# Patient Record
Sex: Female | Born: 2000 | Race: White | Hispanic: No | Marital: Single | Smoking: Never smoker
Health system: Southern US, Community
[De-identification: ages and names within clinical notes are randomized; demographics above are authoritative.]

## PROBLEM LIST (undated history)

## (undated) DIAGNOSIS — Z789 Other specified health status: Secondary | ICD-10-CM

## (undated) HISTORY — PX: NO PAST SURGERIES: SHX2092

## (undated) HISTORY — DX: Other specified health status: Z78.9

---
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
## 2021-01-01 ENCOUNTER — Encounter: Payer: Medicaid Other | Admitting: Obstetrics & Gynecology

## 2021-01-01 ENCOUNTER — Other Ambulatory Visit: Payer: Self-pay

## 2021-01-01 ENCOUNTER — Ambulatory Visit (INDEPENDENT_AMBULATORY_CARE_PROVIDER_SITE_OTHER): Payer: Medicaid Other

## 2021-01-01 VITALS — BP 131/83 | HR 91

## 2021-01-01 DIAGNOSIS — O43199 Other malformation of placenta, unspecified trimester: Secondary | ICD-10-CM

## 2021-01-01 DIAGNOSIS — Z3A36 36 weeks gestation of pregnancy: Secondary | ICD-10-CM | POA: Diagnosis not present

## 2021-01-01 DIAGNOSIS — IMO0002 Reserved for concepts with insufficient information to code with codable children: Secondary | ICD-10-CM

## 2021-01-01 DIAGNOSIS — O0990 Supervision of high risk pregnancy, unspecified, unspecified trimester: Secondary | ICD-10-CM

## 2021-01-01 DIAGNOSIS — O43193 Other malformation of placenta, third trimester: Secondary | ICD-10-CM | POA: Diagnosis not present

## 2021-01-01 NOTE — Progress Notes (Signed)
[place] 36+6 wks,incomplete breech,fhr 133 bpm,posterior placenta gr 2,AFI 10.5 cm,RI .62,.63,.50=44%,BPP 8/8

## 2021-01-03 ENCOUNTER — Ambulatory Visit: Payer: Self-pay | Admitting: *Deleted

## 2021-01-03 NOTE — Telephone Encounter (Signed)
Answer Assessment - Initial Assessment Questions 1. LOCATION: "Where does it hurt?"      Tightness, all over 2. RADIATION: "Does the pain shoot anywhere else?" (e.g., chest, back)     No 3. ONSET: "When did the pain begin?" (Minutes, hours or days ago)      3 days ago 4. ONSET: "Gradual or sudden onset?"     Gradual 5. PATTERN: "Does the pain come and go, or has it been constant since it started?"      Comes and goes, no pattern 6. SEVERITY: "How bad is the pain?" "What does it keep you from doing?"  (e.g., Scale 1-10; mild, moderate, or severe)   - MILD (1-3): doesn't interfere with normal activities, abdomen soft and not tender to touch    - MODERATE (4-7): interferes with normal activities or awakens from sleep, abdomen tender to touch    - SEVERE (8-10): excruciating pain, doubled over, unable to do any normal activities     tightness 7. RECURRENT SYMPTOM: "Have you ever had this type of stomach pain before?" If Yes, ask: "When was the last time?" and "What happened that time?"       8. CAUSE: "What do you think is causing the stomach pain?      9. RELIEVING/AGGRAVATING FACTORS: "What makes it better or worse?" (e.g., antacids, bowel movement, movement)      10. FETAL MOVEMENT: "Has the baby's movement decreased or changed significantly from normal?"       no 11. OTHER SYMPTOMS: "Do you have any other symptoms?" (e.g., back pain, diarrhea, fever, urination pain, vaginal discharge, vomiting)       Vomiting, cannot keep any food down 12. EDD: "What date are you expecting to deliver?"       At 39 weeks, at 37 now  Protocols used: Pregnancy - Abdominal Pain Greater Than [redacted] Weeks EGA-A-AH

## 2021-01-03 NOTE — Telephone Encounter (Signed)
Pt reports [redacted] week pregnant, states is to deliver at 39 weeks. Reports stomach "Tight" x 3 days. Also reports vomiting, states able to drink, stay hydrated, "But can't eat anything." 2 episodes of vomiting this AM. Denies any  pattern to abdominal tightness. "Random" MyChart message sent by RN Crenszo this evening, read to pt, verbalizes understanding. Advised eval at [facility] if worsening. Pt verbalizes understanding.

## 2021-01-04 ENCOUNTER — Other Ambulatory Visit: Payer: Self-pay

## 2021-01-04 ENCOUNTER — Other Ambulatory Visit: Payer: Medicaid Other

## 2021-01-04 ENCOUNTER — Other Ambulatory Visit (HOSPITAL_COMMUNITY)
Admission: RE | Admit: 2021-01-04 | Discharge: 2021-01-04 | Disposition: A | Discharge status: Home / Self Care | Payer: Medicaid Other | Source: Ambulatory Visit | Attending: Medical | Admitting: Medical

## 2021-01-04 ENCOUNTER — Ambulatory Visit (INDEPENDENT_AMBULATORY_CARE_PROVIDER_SITE_OTHER): Payer: Medicaid Other | Admitting: Medical

## 2021-01-04 VITALS — BP 122/83 | HR 84 | Wt 228.0 lb

## 2021-01-04 DIAGNOSIS — Z3A37 37 weeks gestation of pregnancy: Secondary | ICD-10-CM | POA: Diagnosis not present

## 2021-01-04 DIAGNOSIS — O43199 Other malformation of placenta, unspecified trimester: Secondary | ICD-10-CM

## 2021-01-04 DIAGNOSIS — O0990 Supervision of high risk pregnancy, unspecified, unspecified trimester: Secondary | ICD-10-CM

## 2021-01-04 DIAGNOSIS — O365930 Maternal care for other known or suspected poor fetal growth, third trimester, not applicable or unspecified: Secondary | ICD-10-CM

## 2021-01-04 DIAGNOSIS — O321XX0 Maternal care for breech presentation, not applicable or unspecified: Secondary | ICD-10-CM

## 2021-01-04 NOTE — Progress Notes (Signed)
   PRENATAL VISIT NOTE  Subjective:  Christina Shaffer is a 20 y.o. G1P0 at [redacted]w[redacted]d being seen today for ongoing prenatal care.  She is currently monitored for the following issues for this high-risk pregnancy and has Supervision of high risk pregnancy, antepartum; Abnormal chromosomal and genetic finding on antenatal screening mother; Marginal insertion of umbilical cord affecting management of mother; and IUGR (intrauterine growth restriction) affecting care of mother on their problem list.  Patient reports no complaints.  Contractions: Irregular. Vag. Bleeding: None.  Movement: Present. Denies leaking of fluid.   The following portions of the patient's history were reviewed and updated as appropriate: allergies, current medications, past family history, past medical history, past social history, past surgical history and problem list.   Objective:   Vitals:   01/04/21 1059  BP: 122/83  Pulse: 84  Weight: 228 lb (103.4 kg)    Fetal Status: Fetal Heart Rate (bpm): NST   Movement: Present     General:  Alert, oriented and cooperative. Patient is in no acute distress.  Skin: Skin is warm and dry. No rash noted.   Cardiovascular: Normal heart rate noted  Respiratory: Normal respiratory effort, no problems with respiration noted  Abdomen: Soft, gravid, appropriate for gestational age.  Pain/Pressure: Absent     Pelvic: Cervical exam deferred        Extremities: Normal range of motion.  Edema: None  Mental Status: Normal mood and affect. Normal behavior. Normal judgment and thought content.    Fetal Monitoring: Baseline: 130 bpm Variability: moderate Accelerations: 15 x 15, 10 x 10 Decelerations: none Contractions: none  Assessment and Plan:  Pregnancy: G1P0 at [redacted]w[redacted]d 1. Supervision of high risk pregnancy, antepartum - Culture, beta strep (group b only) - Cervicovaginal ancillary only  2. Poor fetal growth affecting management of mother in third trimester, single or unspecified  fetus - Resolved at [place] on 7/19 - EFW 12.5% - Discussed with Dr. Alysia Penna, plan for delivery as indicated at 39 weeks due to breech presentation   3. Marginal insertion of umbilical cord affecting management of mother  4.Breech presentation  - Will schedule for C/S at 39 weeks, declines ECV  5 [redacted] weeks gestation of pregnancy - Culture, beta strep (group b only) - Cervicovaginal ancillary only  Term labor symptoms and general obstetric precautions including but not limited to vaginal bleeding, contractions, leaking of fluid and fetal movement were reviewed in detail with the patient. Please refer to After Visit Summary for other counseling recommendations.   Future Appointments  Date Time Provider Department Center  01/08/2021  1:30 PM [facility] - [facility] [place] [facility] None  01/08/2021  2:30 PM Eure, Amaryllis Dyke, MD [facility]    Vonzella Nipple, PA-C

## 2021-01-07 ENCOUNTER — Other Ambulatory Visit: Payer: Self-pay | Admitting: Obstetrics & Gynecology

## 2021-01-07 DIAGNOSIS — IMO0002 Reserved for concepts with insufficient information to code with codable children: Secondary | ICD-10-CM

## 2021-01-07 DIAGNOSIS — O43199 Other malformation of placenta, unspecified trimester: Secondary | ICD-10-CM

## 2021-01-07 LAB — CERVICOVAGINAL ANCILLARY ONLY
Chlamydia: NEGATIVE
Comment: NEGATIVE
Comment: NORMAL
Neisseria Gonorrhea: NEGATIVE

## 2021-01-08 ENCOUNTER — Other Ambulatory Visit: Payer: Self-pay

## 2021-01-08 ENCOUNTER — Encounter: Payer: Self-pay | Admitting: Obstetrics & Gynecology

## 2021-01-08 ENCOUNTER — Telehealth: Payer: Self-pay | Admitting: *Deleted

## 2021-01-08 ENCOUNTER — Encounter: Payer: Self-pay | Admitting: *Deleted

## 2021-01-08 ENCOUNTER — Ambulatory Visit (INDEPENDENT_AMBULATORY_CARE_PROVIDER_SITE_OTHER): Payer: Medicaid Other | Admitting: Obstetrics & Gynecology

## 2021-01-08 ENCOUNTER — Ambulatory Visit (INDEPENDENT_AMBULATORY_CARE_PROVIDER_SITE_OTHER): Payer: Medicaid Other

## 2021-01-08 VITALS — BP 123/80 | HR 78 | Wt 228.5 lb

## 2021-01-08 DIAGNOSIS — O43199 Other malformation of placenta, unspecified trimester: Secondary | ICD-10-CM | POA: Diagnosis not present

## 2021-01-08 DIAGNOSIS — Z3A37 37 weeks gestation of pregnancy: Secondary | ICD-10-CM

## 2021-01-08 DIAGNOSIS — O0993 Supervision of high risk pregnancy, unspecified, third trimester: Secondary | ICD-10-CM | POA: Diagnosis not present

## 2021-01-08 DIAGNOSIS — O0990 Supervision of high risk pregnancy, unspecified, unspecified trimester: Secondary | ICD-10-CM | POA: Diagnosis not present

## 2021-01-08 DIAGNOSIS — IMO0002 Reserved for concepts with insufficient information to code with codable children: Secondary | ICD-10-CM

## 2021-01-08 DIAGNOSIS — O321XX0 Maternal care for breech presentation, not applicable or unspecified: Secondary | ICD-10-CM

## 2021-01-08 DIAGNOSIS — O365930 Maternal care for other known or suspected poor fetal growth, third trimester, not applicable or unspecified: Secondary | ICD-10-CM

## 2021-01-08 LAB — POCT URINALYSIS DIPSTICK OB
Blood, UA: NEGATIVE
Glucose, UA: NEGATIVE
Ketones, UA: NEGATIVE
Nitrite, UA: NEGATIVE
POC,PROTEIN,UA: NEGATIVE

## 2021-01-08 LAB — CULTURE, BETA STREP (GROUP B ONLY): Strep Gp B Culture: NEGATIVE

## 2021-01-08 NOTE — Progress Notes (Signed)
HIGH-RISK PREGNANCY VISIT Patient name: Christina Shaffer MRN [id number]  Date of birth: Oct 25, 2000 Chief Complaint:   High Risk Gestation ([place] today)  History of Present Illness:   Christina Shaffer is a 20 y.o. G1P0 female at [redacted]w[redacted]d with an Estimated Date of Delivery: 01/23/21 being seen today for ongoing management of a high-risk pregnancy complicated by fetal growth restriction borderline 12.5%.    Today she reports no complaints. Contractions: Not present. Vag. Bleeding: None.  Movement: Present. denies leaking of fluid.   Depression screen [facility] 2/9 10/17/2020 07/18/2020  Decreased Interest 2 3  Down, Depressed, Hopeless 0 0  PHQ - 2 Score 2 3  Altered sleeping 0 0  Tired, decreased energy 0 0  Change in appetite 0 0  Feeling bad or failure about yourself  0 0  Trouble concentrating 0 0  Moving slowly or fidgety/restless 0 0  Suicidal thoughts 0 0  PHQ-9 Score 2 3     GAD 7 : Generalized Anxiety Score 10/17/2020 07/18/2020  Nervous, Anxious, on Edge 0 0  Control/stop worrying 0 0  Worry too much - different things 0 0  Trouble relaxing 0 0  Restless 0 0  Easily annoyed or irritable 0 0  Afraid - awful might happen 0 0  Total GAD 7 Score 0 0     Review of Systems:   Pertinent items are noted in HPI Denies abnormal vaginal discharge w/ itching/odor/irritation, headaches, visual changes, shortness of breath, chest pain, abdominal pain, severe nausea/vomiting, or problems with urination or bowel movements unless otherwise stated above. Pertinent History Reviewed:  Reviewed past medical,surgical, social, obstetrical and family history.  Reviewed problem list, medications and allergies. Physical Assessment:   Vitals:   01/08/21 1401  BP: 123/80  Pulse: 78  Weight: 228 lb 8 oz (103.6 kg)  Body mass index is 38.02 kg/m.           Physical Examination:   General appearance: alert, well appearing, and in no distress  Mental status: alert, oriented to person, place, and  time  Skin: warm & dry   Extremities: Edema: None    Cardiovascular: normal heart rate noted  Respiratory: normal respiratory effort, no distress  Abdomen: gravid, soft, non-tender  Pelvic: Cervical exam deferred         Fetal Status:     Movement: Present    Fetal Surveillance Testing today: BPP 8/8 normal Dopplers   Chaperone: N/A    Results for orders placed or performed in visit on 01/08/21 (from the past 24 hour(s))  POC Urinalysis Dipstick OB   Collection Time: 01/08/21  2:02 PM  Result Value Ref Range   Color, UA     Clarity, UA     Glucose, UA Negative Negative   Bilirubin, UA     Ketones, UA neg    Spec Grav, UA     Blood, UA neg    pH, UA     POC,PROTEIN,UA Negative Negative, Trace, Small (1+), Moderate (2+), Large (3+), 4+   Urobilinogen, UA     Nitrite, UA neg    Leukocytes, UA Moderate (2+) (A) Negative   Appearance     Odor      Assessment & Plan:  High-risk pregnancy: G1P0 at [redacted]w[redacted]d with an Estimated Date of Delivery: 01/23/21   1) FGR, now borderline with MCI fundal anterior, breech, all stable for [facility] 01/16/21    Meds: No orders of the defined types were placed in this encounter.   Labs/procedures today:  [place]  Treatment Plan:  C section 8 days  Reviewed: Term labor symptoms and general obstetric precautions including but not limited to vaginal bleeding, contractions, leaking of fluid and fetal movement were reviewed in detail with the patient.  All questions were answered. Does have home bp cuff. Office bp cuff given: not applicable. Check bp weekly, let us know if consistently >140 and/or >90.  Follow-up: No follow-ups on file.   Future Appointments  Date Time Provider Department Center  01/08/2021  2:30 PM Lazaro Arms, MD [facility]    Orders Placed This Encounter  Procedures   POC Urinalysis Dipstick OB   Lazaro Arms 01/08/2021 2:27 PM

## 2021-01-08 NOTE — Telephone Encounter (Signed)
Call to patient- no answer and no voice mail on cell number. Call to home- advised patient is not available- left message to call back to [place] at [phone]. Calling to advise cesarean section is scheduled on 01-16-21 at 0930.

## 2021-01-08 NOTE — Progress Notes (Signed)
[place] 37+6 wks,frank breech,posterior placenta gr 3,afi 11 cm,RI .50,.57,.61,.55=60%,FHR 132 BPM,BPP 8/8

## 2021-01-08 NOTE — Telephone Encounter (Signed)
Call from patient. Advised of scheduled c-section on 01-16-21 at 0930 and arrive 0730.  Advised letter will be mailed and she will receive pre-op call with instructions.   Encounter closed.

## 2021-01-09 ENCOUNTER — Encounter (HOSPITAL_COMMUNITY): Payer: Self-pay

## 2021-01-09 NOTE — Patient Instructions (Signed)
Christina Shaffer  01/09/2021   Your procedure is scheduled on:  01/16/2021  Arrive at 0745 at Entrance C on [address] at [facility]  and [employer]. You are invited to use the FREE valet parking or use the Visitor's parking deck.  Pick up the phone at the desk and dial [phone].  Call this number if you have problems the morning of surgery: [phone]  Remember:   Do not eat food:(After Midnight) Desps de medianoche.  Do not drink clear liquids: (After Midnight) Desps de medianoche.  Take these medicines the morning of surgery with A SIP OF WATER:  none   Do not wear jewelry, make-up or nail polish.  Do not wear lotions, powders, or perfumes. Do not wear deodorant.  Do not shave 48 hours prior to surgery.  Do not bring valuables to the hospital.  [facility] is not   responsible for any belongings or valuables brought to the hospital.  Contacts, dentures or bridgework may not be worn into surgery.  Leave suitcase in the car. After surgery it may be brought to your room.  For patients admitted to the hospital, checkout time is 11:00 AM the day of              discharge.      Please read over the following fact sheets that you were given:     Preparing for Surgery

## 2021-01-11 DIAGNOSIS — H66002 Acute suppurative otitis media without spontaneous rupture of ear drum, left ear: Secondary | ICD-10-CM | POA: Diagnosis not present

## 2021-01-12 ENCOUNTER — Other Ambulatory Visit: Payer: Self-pay | Admitting: Obstetrics and Gynecology

## 2021-01-14 ENCOUNTER — Other Ambulatory Visit: Payer: Self-pay

## 2021-01-14 ENCOUNTER — Encounter (HOSPITAL_COMMUNITY)
Admission: RE | Admit: 2021-01-14 | Discharge: 2021-01-14 | Disposition: A | Discharge status: Home / Self Care | Payer: Medicaid Other | Source: Ambulatory Visit | Attending: Obstetrics and Gynecology | Admitting: Obstetrics and Gynecology

## 2021-01-14 ENCOUNTER — Other Ambulatory Visit: Payer: Self-pay | Admitting: Obstetrics and Gynecology

## 2021-01-14 DIAGNOSIS — Z01812 Encounter for preprocedural laboratory examination: Secondary | ICD-10-CM | POA: Insufficient documentation

## 2021-01-14 LAB — CBC
HCT: 39.4 % (ref 36.0–46.0)
Hemoglobin: 12.9 g/dL (ref 12.0–15.0)
MCH: 27.9 pg (ref 26.0–34.0)
MCHC: 32.7 g/dL (ref 30.0–36.0)
MCV: 85.3 fL (ref 80.0–100.0)
Platelets: 228 10*3/uL (ref 150–400)
RBC: 4.62 MIL/uL (ref 3.87–5.11)
RDW: 14 % (ref 11.5–15.5)
WBC: 6.7 10*3/uL (ref 4.0–10.5)
nRBC: 0 % (ref 0.0–0.2)

## 2021-01-14 LAB — TYPE AND SCREEN
ABO/RH(D): O POS
Antibody Screen: NEGATIVE

## 2021-01-15 LAB — SARS CORONAVIRUS 2 (TAT 6-24 HRS): SARS Coronavirus 2: NEGATIVE

## 2021-01-15 LAB — RPR: RPR Ser Ql: NONREACTIVE

## 2021-01-15 NOTE — H&P (Addendum)
OBSTETRIC ADMISSION HISTORY AND PHYSICAL  MATHA MASSE is a 20 y.o. female G1P0 with IUP at [redacted]w[redacted]d by LMP consistent with 8 week [place] presenting for scheduled primary CS for breech positioning. She reports +FMs, no LOF, no VB, no blurry vision, headaches, peripheral edema, or RUQ pain.  She plans on bottle feeding but is interested in learning more about breast feeding as well. She is planning to use condoms for birth control.  She received her prenatal care at [facility]   Dating: By LMP --->  Estimated Date of Delivery: 01/23/21  Sono:    @[redacted]w[redacted]d , CWD, normal anatomy, frank breech presentation, placenta posterior  @ [redacted]w[redacted]d, CWD, normal anatomy, frank breech, placenta posterior, 2358 g, 12.5 % EFW  Prenatal History/Complications:  IUGR Abnormal genetic screening Marginal cord insertion  Past Medical History: Past Medical History:  Diagnosis Date   Medical history non-contributory     Past Surgical History: Past Surgical History:  Procedure Laterality Date   NO PAST SURGERIES      Obstetrical History: OB History     Gravida  1   Para      Term      Preterm      AB      Living         SAB      IAB      Ectopic      Multiple      Live Births              Social History Social History   Socioeconomic History   Marital status: Single    Spouse name: Not on file   Number of children: Not on file   Years of education: Not on file   Highest education level: Not on file  Occupational History   Not on file  Tobacco Use   Smoking status: Never   Smokeless tobacco: Never  Vaping Use   Vaping Use: Never used  Substance and Sexual Activity   Alcohol use: Not Currently   Drug use: No   Sexual activity: Yes    Birth control/protection: None  Other Topics Concern   Not on file  Social History Narrative   Not on file   Social Determinants of Health   Financial Resource Strain: Low Risk    Difficulty of Paying Living Expenses: Not hard at all   Food Insecurity: No Food Insecurity   Worried About [redacted]w[redacted]d in the Last Year: Never true   Ran Out of Food in the Last Year: Never true  Transportation Needs: No Transportation Needs   Lack of Transportation (Medical): No   Lack of Transportation (Non-Medical): No  Physical Activity: Sufficiently Active   Days of Exercise per Week: 4 days   Minutes of Exercise per Session: 60 min  Stress: No Stress Concern Present   Feeling of Stress : Not at all  Social Connections: Moderately Isolated   Frequency of Communication with Friends and Family: More than three times a week   Frequency of Social Gatherings with Friends and Family: More than three times a week   Attends Religious Services: 1 to 4 times per year   Active Member of Programme researcher, broadcasting/film/video or Organizations: No   Attends [employer]: Never   Marital Status: Never married    Family History: Family History  Problem Relation Age of Onset   Heart disease Maternal Grandmother     Allergies: No Known Allergies  Medications Prior to Admission  Medication Sig Dispense Refill Last Dose   Prenatal Vit-Fe Fumarate-FA (PRENATAL VITAMINS) 28-0.8 MG TABS Take 1 tablet by mouth daily. 90 tablet 3 01/15/2021   Blood Pressure Monitor MISC For regular home bp monitoring during pregnancy (Patient not taking: No sig reported) 1 each 0      Review of Systems   All systems reviewed and negative except as stated in HPI  Blood pressure (!) 131/91, pulse 91, temperature 98.7 F (37.1 C), temperature source Oral, height 5\' 5"  (1.651 m), weight 108.9 kg, last menstrual period 04/18/2020, SpO2 99 %.  General appearance: alert, cooperative, and no distress Lungs: clear to auscultation bilaterally Heart: regular rate and rhythm Abdomen: soft, non-tender; bowel sounds normal Extremities: no LE edema Presentation: breech confirmed by bedside ultrasound  Prenatal labs: ABO, Rh: --/--/O POS (08/08 1032) Antibody: NEG (08/08  1032) Rubella: 4.34 (02/08 1155) RPR: NON REACTIVE (08/08 1024)  HBsAg: Negative (02/08 1155)  HIV: Non Reactive (05/11 0826)  GBS: Negative/-- (07/29 1534)  1 hr Glucola 134 Genetic screening - Abnormal Panorama with atypical sex chromosome finding, normal anatomy on 09[birth year]25-1989 Anatomy US normal  Prenatal Transfer Tool  Maternal Diabetes: No Genetic Screening: Abnormal:  Results: Other: Panorama with atypical sex chromosome finding Maternal Ultrasounds/Referrals: Normal Fetal Ultrasounds or other Referrals:  Referred to Materal Fetal Medicine  Maternal Substance Abuse:  No Significant Maternal Medications:  None Significant Maternal Lab Results: Group B Strep negative  No results found for this or any previous visit (from the past 24 hour(s)).  Patient Active Problem List   Diagnosis Date Noted   Breech presentation 01/16/2021   IUGR (intrauterine growth restriction) affecting care of mother 11/28/2020   Marginal insertion of umbilical cord affecting management of mother 08/23/2020   Abnormal chromosomal and genetic finding on antenatal screening mother 08/07/2020   Supervision of high risk pregnancy, antepartum 07/13/2020    Assessment/Plan:  GENESI STEFANKO is a 20 y.o. G1P0 at [redacted]w[redacted]d here for scheduled primary Cesarean section due to breech positioning.  Counseled regarding VBAC; patient declines VBAC.  The risks of surgery were discussed with the patient including but were not limited to: bleeding which may require transfusion or reoperation; infection which may require antibiotics; injury to bowel, bladder, ureters or other surrounding organs; injury to the fetus; need for additional procedures including hysterectomy in the event of a life-threatening hemorrhage; formation of adhesions; placental abnormalities with subsequent pregnancies; incisional problems; thromboembolic phenomenon and other postoperative/anesthesia complications.  The patient concurred with the proposed plan,  giving informed written consent for the procedure.   Patient has been NPO since 6 PM last evening and she will remain NPO for procedure. Anesthesia and OR aware. Preoperative prophylactic antibiotics and SCDs ordered on call to the OR.  To OR when ready.  # Elevated BP: initial BPs elevated. Asymptomatic. Will check preE labs, monitor #IUGR: Normal dopplers on 8/2 with normal BPP; EFW 12.5% at [redacted]w[redacted]d #Abnormal genetic screening: Noted on Panorama with atypical sex chromosome finding; [redacted]w[redacted]d with normal anatomy #Pain: Spinal #ID:  GBS negative; will receive antibiotics pre-operatively  #MOF: Bottle #MOC: Condoms  [place], MD  Obstetrics Fellow 01/16/2021, 8:58 AM

## 2021-01-16 ENCOUNTER — Inpatient Hospital Stay (HOSPITAL_COMMUNITY)
Admission: RE | Admit: 2021-01-16 | Discharge: 2021-01-18 | DRG: 788 | Disposition: A | Discharge status: Home / Self Care | Payer: Medicaid Other | Attending: Obstetrics and Gynecology | Admitting: Obstetrics and Gynecology

## 2021-01-16 ENCOUNTER — Other Ambulatory Visit: Payer: Self-pay

## 2021-01-16 ENCOUNTER — Encounter (HOSPITAL_COMMUNITY): Payer: Self-pay | Admitting: Obstetrics and Gynecology

## 2021-01-16 ENCOUNTER — Inpatient Hospital Stay (HOSPITAL_COMMUNITY): Payer: Medicaid Other | Admitting: Anesthesiology

## 2021-01-16 ENCOUNTER — Encounter (HOSPITAL_COMMUNITY)
Admission: RE | Disposition: A | Discharge status: Home / Self Care | Payer: Self-pay | Source: Home / Self Care | Attending: Obstetrics and Gynecology

## 2021-01-16 DIAGNOSIS — O321XX0 Maternal care for breech presentation, not applicable or unspecified: Principal | ICD-10-CM | POA: Diagnosis present

## 2021-01-16 DIAGNOSIS — O285 Abnormal chromosomal and genetic finding on antenatal screening of mother: Secondary | ICD-10-CM | POA: Diagnosis present

## 2021-01-16 DIAGNOSIS — O43199 Other malformation of placenta, unspecified trimester: Secondary | ICD-10-CM | POA: Diagnosis present

## 2021-01-16 DIAGNOSIS — O365930 Maternal care for other known or suspected poor fetal growth, third trimester, not applicable or unspecified: Secondary | ICD-10-CM | POA: Diagnosis present

## 2021-01-16 DIAGNOSIS — Z3A38 38 weeks gestation of pregnancy: Secondary | ICD-10-CM

## 2021-01-16 DIAGNOSIS — O43123 Velamentous insertion of umbilical cord, third trimester: Secondary | ICD-10-CM | POA: Diagnosis present

## 2021-01-16 DIAGNOSIS — O365990 Maternal care for other known or suspected poor fetal growth, unspecified trimester, not applicable or unspecified: Secondary | ICD-10-CM | POA: Diagnosis present

## 2021-01-16 DIAGNOSIS — Z3A39 39 weeks gestation of pregnancy: Secondary | ICD-10-CM | POA: Diagnosis not present

## 2021-01-16 DIAGNOSIS — O99214 Obesity complicating childbirth: Secondary | ICD-10-CM | POA: Diagnosis present

## 2021-01-16 LAB — COMPREHENSIVE METABOLIC PANEL
ALT: 15 U/L (ref 0–44)
AST: 19 U/L (ref 15–41)
Albumin: 2.5 g/dL — ABNORMAL LOW (ref 3.5–5.0)
Alkaline Phosphatase: 144 U/L — ABNORMAL HIGH (ref 38–126)
Anion gap: 7 (ref 5–15)
BUN: <5 mg/dL — ABNORMAL LOW (ref 6–20)
CO2: 22 mmol/L (ref 22–32)
Calcium: 8.8 mg/dL — ABNORMAL LOW (ref 8.9–10.3)
Chloride: 108 mmol/L (ref 98–111)
Creatinine, Ser: 0.79 mg/dL (ref 0.44–1.00)
GFR, Estimated: >60 mL/min (ref >60)
Glucose, Bld: 86 mg/dL (ref 70–99)
Potassium: 3.9 mmol/L (ref 3.5–5.1)
Sodium: 137 mmol/L (ref 135–145)
Total Bilirubin: 0.3 mg/dL (ref 0.3–1.2)
Total Protein: 5.4 g/dL — ABNORMAL LOW (ref 6.5–8.1)

## 2021-01-16 LAB — CBC
HCT: 36.1 % (ref 36.0–46.0)
Hemoglobin: 11.9 g/dL — ABNORMAL LOW (ref 12.0–15.0)
MCH: 27.8 pg (ref 26.0–34.0)
MCHC: 33 g/dL (ref 30.0–36.0)
MCV: 84.3 fL (ref 80.0–100.0)
Platelets: 206 10*3/uL (ref 150–400)
RBC: 4.28 MIL/uL (ref 3.87–5.11)
RDW: 14.1 % (ref 11.5–15.5)
WBC: 6.8 10*3/uL (ref 4.0–10.5)
nRBC: 0 % (ref 0.0–0.2)

## 2021-01-16 LAB — PROTEIN / CREATININE RATIO, URINE
Creatinine, Urine: 169.08 mg/dL
Protein Creatinine Ratio: 0.18 mg/mg{Cre} — ABNORMAL HIGH (ref 0.00–0.15)
Total Protein, Urine: 31 mg/dL

## 2021-01-16 SURGERY — Surgical Case
Anesthesia: Spinal | Wound class: Clean Contaminated

## 2021-01-16 MED ORDER — KETOROLAC TROMETHAMINE 30 MG/ML IJ SOLN
INTRAMUSCULAR | Status: AC
  Filled 2021-01-16: qty 1

## 2021-01-16 MED ORDER — CEFAZOLIN SODIUM-DEXTROSE 2-4 GM/100ML-% IV SOLN
2.0000 g | INTRAVENOUS | Status: AC
  Administered 2021-01-16: 2 g via INTRAVENOUS

## 2021-01-16 MED ORDER — SODIUM CHLORIDE 0.9 % IR SOLN
Status: DC | PRN
  Administered 2021-01-16: 1000 mL

## 2021-01-16 MED ORDER — DIPHENHYDRAMINE HCL 25 MG PO CAPS: 25.0000 mg | ORAL_CAPSULE | ORAL | Status: DC | PRN

## 2021-01-16 MED ORDER — SCOPOLAMINE 1 MG/3DAYS TD PT72
MEDICATED_PATCH | TRANSDERMAL | Status: AC
  Filled 2021-01-16: qty 1

## 2021-01-16 MED ORDER — SOD CITRATE-CITRIC ACID 500-334 MG/5ML PO SOLN
ORAL | Status: AC
  Filled 2021-01-16: qty 30

## 2021-01-16 MED ORDER — CEFAZOLIN SODIUM-DEXTROSE 2-4 GM/100ML-% IV SOLN
INTRAVENOUS | Status: AC
  Filled 2021-01-16: qty 100

## 2021-01-16 MED ORDER — SENNOSIDES-DOCUSATE SODIUM 8.6-50 MG PO TABS
2.0000 | ORAL_TABLET | ORAL | Status: DC
  Administered 2021-01-16 – 2021-01-17 (×2): 2 via ORAL
  Filled 2021-01-16 (×2): qty 2

## 2021-01-16 MED ORDER — LACTATED RINGERS IV BOLUS
500.0000 mL | Freq: Once | INTRAVENOUS | Status: AC
  Administered 2021-01-16: 500 mL via INTRAVENOUS

## 2021-01-16 MED ORDER — IBUPROFEN 600 MG PO TABS
600.0000 mg | ORAL_TABLET | Freq: Four times a day (QID) | ORAL | Status: DC
  Administered 2021-01-16 – 2021-01-18 (×8): 600 mg via ORAL
  Filled 2021-01-16 (×8): qty 1

## 2021-01-16 MED ORDER — MORPHINE SULFATE (PF) 0.5 MG/ML IJ SOLN
INTRAMUSCULAR | Status: AC
  Filled 2021-01-16: qty 10

## 2021-01-16 MED ORDER — KETOROLAC TROMETHAMINE 30 MG/ML IJ SOLN
30.0000 mg | Freq: Four times a day (QID) | INTRAMUSCULAR | Status: AC | PRN
  Administered 2021-01-16: 30 mg via INTRAVENOUS

## 2021-01-16 MED ORDER — BUPIVACAINE IN DEXTROSE 0.75-8.25 % IT SOLN
INTRATHECAL | Status: DC | PRN
  Administered 2021-01-16: 1.6 mL via INTRATHECAL

## 2021-01-16 MED ORDER — ACETAMINOPHEN 500 MG PO TABS
1000.0000 mg | ORAL_TABLET | Freq: Four times a day (QID) | ORAL | Status: DC
  Administered 2021-01-16 – 2021-01-18 (×8): 1000 mg via ORAL
  Filled 2021-01-16 (×8): qty 2

## 2021-01-16 MED ORDER — DIPHENHYDRAMINE HCL 50 MG/ML IJ SOLN: 12.5000 mg | INTRAMUSCULAR | Status: DC | PRN

## 2021-01-16 MED ORDER — LACTATED RINGERS IV SOLN: INTRAVENOUS | Status: DC

## 2021-01-16 MED ORDER — MEPERIDINE HCL 25 MG/ML IJ SOLN: 6.2500 mg | INTRAMUSCULAR | Status: DC | PRN

## 2021-01-16 MED ORDER — BUPIVACAINE IN DEXTROSE 0.75-8.25 % IT SOLN
INTRATHECAL | Status: AC
  Filled 2021-01-16: qty 2

## 2021-01-16 MED ORDER — MENTHOL 3 MG MT LOZG: 1.0000 | LOZENGE | OROMUCOSAL | Status: DC | PRN

## 2021-01-16 MED ORDER — COCONUT OIL OIL: 1.0000 "application " | TOPICAL_OIL | Status: DC | PRN

## 2021-01-16 MED ORDER — SODIUM CHLORIDE 0.9% FLUSH: 3.0000 mL | INTRAVENOUS | Status: DC | PRN

## 2021-01-16 MED ORDER — FENTANYL CITRATE (PF) 100 MCG/2ML IJ SOLN
INTRAMUSCULAR | Status: AC
  Filled 2021-01-16: qty 2

## 2021-01-16 MED ORDER — DIBUCAINE (PERIANAL) 1 % EX OINT: 1.0000 "application " | TOPICAL_OINTMENT | CUTANEOUS | Status: DC | PRN

## 2021-01-16 MED ORDER — ONDANSETRON HCL 4 MG/2ML IJ SOLN: 4.0000 mg | Freq: Three times a day (TID) | INTRAMUSCULAR | Status: DC | PRN

## 2021-01-16 MED ORDER — PRENATAL MULTIVITAMIN CH
1.0000 | ORAL_TABLET | Freq: Every day | ORAL | Status: DC
  Administered 2021-01-18: 1 via ORAL
  Filled 2021-01-16 (×2): qty 1

## 2021-01-16 MED ORDER — MEASLES, MUMPS & RUBELLA VAC IJ SOLR: 0.5000 mL | Freq: Once | INTRAMUSCULAR | Status: DC

## 2021-01-16 MED ORDER — NALBUPHINE HCL 10 MG/ML IJ SOLN: 5.0000 mg | INTRAMUSCULAR | Status: DC | PRN

## 2021-01-16 MED ORDER — ONDANSETRON HCL 4 MG/2ML IJ SOLN
INTRAMUSCULAR | Status: AC
  Filled 2021-01-16: qty 2

## 2021-01-16 MED ORDER — POVIDONE-IODINE 10 % EX SWAB: 2.0000 "application " | Freq: Once | CUTANEOUS | Status: DC

## 2021-01-16 MED ORDER — KETOROLAC TROMETHAMINE 30 MG/ML IJ SOLN: 30.0000 mg | Freq: Four times a day (QID) | INTRAMUSCULAR | Status: AC | PRN

## 2021-01-16 MED ORDER — STERILE WATER FOR IRRIGATION IR SOLN
Status: DC | PRN
  Administered 2021-01-16: 1000 mL

## 2021-01-16 MED ORDER — LACTATED RINGERS IV SOLN: INTRAVENOUS | Status: DC | PRN

## 2021-01-16 MED ORDER — ONDANSETRON HCL 4 MG/2ML IJ SOLN
INTRAMUSCULAR | Status: DC | PRN
  Administered 2021-01-16: 4 mg via INTRAVENOUS

## 2021-01-16 MED ORDER — FENTANYL CITRATE (PF) 100 MCG/2ML IJ SOLN: 25.0000 ug | INTRAMUSCULAR | Status: DC | PRN

## 2021-01-16 MED ORDER — OXYTOCIN-SODIUM CHLORIDE 30-0.9 UT/500ML-% IV SOLN
2.5000 [IU]/h | INTRAVENOUS | Status: AC
  Administered 2021-01-16: 2.5 [IU]/h via INTRAVENOUS
  Filled 2021-01-16: qty 500

## 2021-01-16 MED ORDER — SIMETHICONE 80 MG PO CHEW: 80.0000 mg | CHEWABLE_TABLET | ORAL | Status: DC | PRN

## 2021-01-16 MED ORDER — SIMETHICONE 80 MG PO CHEW
80.0000 mg | CHEWABLE_TABLET | Freq: Three times a day (TID) | ORAL | Status: DC
  Administered 2021-01-16 – 2021-01-18 (×5): 80 mg via ORAL
  Filled 2021-01-16 (×5): qty 1

## 2021-01-16 MED ORDER — NALOXONE HCL 0.4 MG/ML IJ SOLN: 0.4000 mg | INTRAMUSCULAR | Status: DC | PRN

## 2021-01-16 MED ORDER — SCOPOLAMINE 1 MG/3DAYS TD PT72
1.0000 | MEDICATED_PATCH | TRANSDERMAL | Status: DC
  Administered 2021-01-16: 1.5 mg via TRANSDERMAL
  Filled 2021-01-16: qty 1

## 2021-01-16 MED ORDER — OXYTOCIN-SODIUM CHLORIDE 30-0.9 UT/500ML-% IV SOLN
INTRAVENOUS | Status: DC | PRN
  Administered 2021-01-16: 30 [IU] via INTRAVENOUS

## 2021-01-16 MED ORDER — NALOXONE HCL 4 MG/10ML IJ SOLN
1.0000 ug/kg/h | INTRAVENOUS | Status: DC | PRN
  Filled 2021-01-16: qty 5

## 2021-01-16 MED ORDER — NALBUPHINE HCL 10 MG/ML IJ SOLN: 5.0000 mg | Freq: Once | INTRAMUSCULAR | Status: DC | PRN

## 2021-01-16 MED ORDER — OXYCODONE HCL 5 MG PO TABS
5.0000 mg | ORAL_TABLET | ORAL | Status: DC | PRN
  Administered 2021-01-17: 5 mg via ORAL
  Filled 2021-01-16: qty 1

## 2021-01-16 MED ORDER — DIPHENHYDRAMINE HCL 25 MG PO CAPS: 25.0000 mg | ORAL_CAPSULE | Freq: Four times a day (QID) | ORAL | Status: DC | PRN

## 2021-01-16 MED ORDER — SOD CITRATE-CITRIC ACID 500-334 MG/5ML PO SOLN
30.0000 mL | Freq: Once | ORAL | Status: AC
  Administered 2021-01-16: 30 mL via ORAL

## 2021-01-16 MED ORDER — WITCH HAZEL-GLYCERIN EX PADS: 1.0000 "application " | MEDICATED_PAD | CUTANEOUS | Status: DC | PRN

## 2021-01-16 MED ORDER — ENOXAPARIN SODIUM 60 MG/0.6ML IJ SOSY
0.5000 mg/kg | PREFILLED_SYRINGE | INTRAMUSCULAR | Status: DC
  Administered 2021-01-17 – 2021-01-18 (×2): 55 mg via SUBCUTANEOUS
  Filled 2021-01-16 (×2): qty 0.6

## 2021-01-16 MED ORDER — MORPHINE SULFATE (PF) 0.5 MG/ML IJ SOLN
INTRAMUSCULAR | Status: DC | PRN
  Administered 2021-01-16: 150 ug via INTRATHECAL

## 2021-01-16 MED ORDER — FENTANYL CITRATE (PF) 100 MCG/2ML IJ SOLN
INTRAMUSCULAR | Status: DC | PRN
  Administered 2021-01-16: 15 ug via INTRATHECAL

## 2021-01-16 MED ORDER — PHENYLEPHRINE HCL-NACL 20-0.9 MG/250ML-% IV SOLN
INTRAVENOUS | Status: DC | PRN
  Administered 2021-01-16: 60 ug/min via INTRAVENOUS

## 2021-01-16 MED ORDER — ZOLPIDEM TARTRATE 5 MG PO TABS: 5.0000 mg | ORAL_TABLET | Freq: Every evening | ORAL | Status: DC | PRN

## 2021-01-16 SURGICAL SUPPLY — 38 items
APL SKNCLS STERI-STRIP NONHPOA (GAUZE/BANDAGES/DRESSINGS) ×1
BENZOIN TINCTURE PRP APPL 2/3 (GAUZE/BANDAGES/DRESSINGS) ×2 IMPLANT
CHLORAPREP W/TINT 26ML (MISCELLANEOUS) ×2 IMPLANT
CLAMP CORD UMBIL (MISCELLANEOUS) IMPLANT
CLOTH BEACON ORANGE TIMEOUT ST (SAFETY) ×2 IMPLANT
DRAPE C SECTION CLR SCREEN (DRAPES) ×1 IMPLANT
DRSG OPSITE POSTOP 4X10 (GAUZE/BANDAGES/DRESSINGS) ×2 IMPLANT
ELECT REM PT RETURN 9FT ADLT (ELECTROSURGICAL) ×2
ELECTRODE REM PT RTRN 9FT ADLT (ELECTROSURGICAL) ×1 IMPLANT
EXTRACTOR VACUUM M CUP 4 TUBE (SUCTIONS) IMPLANT
GLOVE BIOGEL PI IND STRL 7.0 (GLOVE) ×2 IMPLANT
GLOVE BIOGEL PI IND STRL 7.5 (GLOVE) ×2 IMPLANT
GLOVE BIOGEL PI INDICATOR 7.0 (GLOVE) ×2
GLOVE BIOGEL PI INDICATOR 7.5 (GLOVE) ×2
GLOVE ECLIPSE 7.5 STRL STRAW (GLOVE) ×2 IMPLANT
GOWN STRL REUS W/TWL LRG LVL3 (GOWN DISPOSABLE) ×6 IMPLANT
KIT ABG SYR 3ML LUER SLIP (SYRINGE) IMPLANT
NDL HYPO 25X5/8 SAFETYGLIDE (NEEDLE) IMPLANT
NEEDLE HYPO 25X5/8 SAFETYGLIDE (NEEDLE) IMPLANT
NS IRRIG 1000ML POUR BTL (IV SOLUTION) ×2 IMPLANT
PACK C SECTION WH (CUSTOM PROCEDURE TRAY) ×2 IMPLANT
PAD OB MATERNITY 4.3X12.25 (PERSONAL CARE ITEMS) ×2 IMPLANT
PENCIL SMOKE EVAC W/HOLSTER (ELECTROSURGICAL) ×2 IMPLANT
RETRACTOR TRAXI PANNICULUS (MISCELLANEOUS) IMPLANT
RTRCTR C-SECT PINK 25CM LRG (MISCELLANEOUS) ×2 IMPLANT
STRIP CLOSURE SKIN 1/2X4 (GAUZE/BANDAGES/DRESSINGS) ×2 IMPLANT
SUT PLAIN 2 0 (SUTURE) ×2
SUT PLAIN ABS 2-0 CT1 27XMFL (SUTURE) IMPLANT
SUT VIC AB 0 CT1 36 (SUTURE) ×2 IMPLANT
SUT VIC AB 0 CTX 36 (SUTURE) ×4
SUT VIC AB 0 CTX36XBRD ANBCTRL (SUTURE) ×2 IMPLANT
SUT VIC AB 2-0 CT1 27 (SUTURE) ×2
SUT VIC AB 2-0 CT1 TAPERPNT 27 (SUTURE) ×1 IMPLANT
SUT VIC AB 4-0 KS 27 (SUTURE) ×2 IMPLANT
TOWEL OR 17X24 6PK STRL BLUE (TOWEL DISPOSABLE) ×2 IMPLANT
TRAXI PANNICULUS RETRACTOR (MISCELLANEOUS) ×1
TRAY FOLEY W/BAG SLVR 14FR LF (SET/KITS/TRAYS/PACK) ×2 IMPLANT
WATER STERILE IRR 1000ML POUR (IV SOLUTION) ×2 IMPLANT

## 2021-01-16 NOTE — Anesthesia Preprocedure Evaluation (Addendum)
Anesthesia Evaluation  Patient identified by MRN, date of birth, ID band Patient awake    Reviewed: Allergy & Precautions, NPO status , Patient's Chart, lab work & pertinent test results  Airway Mallampati: II  TM Distance: >3 FB Neck ROM: Full    Dental no notable dental hx. (+) Teeth Intact, Dental Advisory Given   Pulmonary neg pulmonary ROS,    Pulmonary exam normal breath sounds clear to auscultation       Cardiovascular negative cardio ROS Normal cardiovascular exam Rhythm:Regular Rate:Normal     Neuro/Psych negative neurological ROS  negative psych ROS   GI/Hepatic Neg liver ROS, GERD  ,  Endo/Other  Obesity  Renal/GU negative Renal ROS  negative genitourinary   Musculoskeletal negative musculoskeletal ROS (+)   Abdominal (+) + obese,   Peds  Hematology negative hematology ROS (+)   Anesthesia Other Findings   Reproductive/Obstetrics (+) Pregnancy Breech presentation 39 weeks                            Anesthesia Physical Anesthesia Plan  ASA: 3  Anesthesia Plan: Spinal   Post-op Pain Management:    Induction:   PONV Risk Score and Plan: 4 or greater and Treatment may vary due to age or medical condition, Ondansetron and Scopolamine patch - Pre-op  Airway Management Planned: Natural Airway  Additional Equipment:   Intra-op Plan:   Post-operative Plan:   Informed Consent: I have reviewed the patients History and Physical, chart, labs and discussed the procedure including the risks, benefits and alternatives for the proposed anesthesia with the patient or authorized representative who has indicated his/her understanding and acceptance.     Dental advisory given  Plan Discussed with: CRNA and Anesthesiologist  Anesthesia Plan Comments:        Anesthesia Quick Evaluation

## 2021-01-16 NOTE — Discharge Summary (Addendum)
Postpartum Discharge Summary  Date of Service updated: 01/18/21     Patient Name: Christina Shaffer DOB: 2001-02-19 MRN: [id number]  Date of admission: 01/16/2021 Delivery date:01/16/2021  Delivering provider: Laurey Arrow BEDFORD  Date of discharge: 01/18/2021  Admitting diagnosis: Breech presentation [O32.1XX0] Intrauterine pregnancy: [redacted]w[redacted]d    Secondary diagnosis:  Principal Problem:   Cesarean delivery delivered Active Problems:   Abnormal chromosomal and genetic finding on antenatal screening mother   Marginal insertion of umbilical cord affecting management of mother   IUGR (intrauterine growth restriction) affecting care of mother   Breech presentation  Additional problems: Elevated BP x1; Pre-E labs obtained on admission wnl    Discharge diagnosis: Term Pregnancy Delivered                                              Post partum procedures: N/A Augmentation: N/A Complications: None  Hospital course: Sceduled C/S   20y.o. yo G1P1001 at 355w0das admitted to the hospital 01/16/2021 for scheduled cesarean section with the following indication:Malpresentation.Delivery details are as follows:  Membrane Rupture Time/Date: 10:06 AM ,01/16/2021   Delivery Method:C-Section, Low Transverse  Details of operation can be found in separate operative note.  Patient had an uncomplicated postpartum course.  She is ambulating, tolerating a regular diet, passing flatus, and urinating well. Patient is discharged home in stable condition on  01/18/21  - pad: I counseled the patient on continuing home Bps intermittently for one week given initial Bps on admissions. Pt states she has cuff at home and will check them and can submit them through babscripts or will call the office if elevated        Newborn Data: Birth date:01/16/2021  Birth time:10:07 AM  Gender:Female  Living status:Living  Apgars:8 ,9  Weight:2815 g     Magnesium Sulfate received: No BMZ received:  No Rhophylac:N/A MMR:N/A - Immune T-DaP: Needs postpartum Flu: No Transfusion:No  Physical exam  Vitals:   01/16/21 2340 01/17/21 0600 01/17/21 2136 01/18/21 0458  BP: 121/82 123/82 125/80 124/69  Pulse: 75 65 (!) 53 (!) 47  Resp: 17 18 18    Temp: 98.2 F (36.8 C) 98 F (36.7 C) 97.6 F (36.4 C) 98.4 F (36.9 C)  TempSrc: Oral Oral Oral Oral  SpO2: 98% 100%    Weight:      Height:       General: alert, cooperative, and no distress Lochia: appropriate Uterine Fundus: firm Incision: Dressing is clean, dry, and intact DVT Evaluation: No evidence of DVT seen on physical exam. Labs: Lab Results  Component Value Date   WBC 6.8 01/16/2021   HGB 10.7 (L) 01/17/2021   HCT 36.1 01/16/2021   MCV 84.3 01/16/2021   PLT 206 01/16/2021   CMP Latest Ref Rng & Units 01/16/2021  Glucose 70 - 99 mg/dL 86  BUN 6 - 20 mg/dL <5(L)  Creatinine 0.44 - 1.00 mg/dL 0.79  Sodium 135 - 145 mmol/L 137  Potassium 3.5 - 5.1 mmol/L 3.9  Chloride 98 - 111 mmol/L 108  CO2 22 - 32 mmol/L 22  Calcium 8.9 - 10.3 mg/dL 8.8(L)  Total Protein 6.5 - 8.1 g/dL 5.4(L)  Total Bilirubin 0.3 - 1.2 mg/dL 0.3  Alkaline Phos 38 - 126 U/L 144(H)  AST 15 - 41 U/L 19  ALT 0 - 44 U/L 15   [place]  Score: Edinburgh Postnatal Depression Scale Screening Tool 01/17/2021  I have been able to laugh and see the funny side of things. 0  I have looked forward with enjoyment to things. 0  I have blamed myself unnecessarily when things went wrong. 0  I have been anxious or worried for no good reason. 0  I have felt scared or panicky for no good reason. 0  Things have been getting on top of me. 0  I have been so unhappy that I have had difficulty sleeping. 0  I have felt sad or miserable. 0  I have been so unhappy that I have been crying. 0  The thought of harming myself has occurred to me. 0  Edinburgh Postnatal Depression Scale Total 0     After visit meds:  Allergies as of 01/18/2021   No Known Allergies       Medication List     STOP taking these medications    Blood Pressure Monitor Misc   Prenatal Vitamins 28-0.8 MG Tabs       TAKE these medications    acetaminophen 325 MG tablet Commonly known as: Tylenol Take 2 tablets (650 mg total) by mouth every 6 (six) hours as needed.   ibuprofen 600 MG tablet Commonly known as: ADVIL Take 1 tablet (600 mg total) by mouth every 6 (six) hours.   oxyCODONE 5 MG immediate release tablet Commonly known as: Oxy IR/ROXICODONE Take 1 tablet (5 mg total) by mouth every 4 (four) hours as needed for moderate pain.         Discharge home in stable condition Infant Feeding: Bottle Infant Disposition:home with mother Discharge instruction: per After Visit Summary and Postpartum booklet. Activity: Advance as tolerated. Pelvic rest for 6 weeks.  Diet: routine diet Future Appointments: Future Appointments  Date Time Provider [place]  01/25/2021 11:10 AM Roma Schanz, CNM [facility]  02/21/2021 10:50 AM Cresenzo-Dishmon, Joaquim Lai, CNM [facility]   Follow up Visit: Message sent on 8/10.  Please schedule this patient for a In person postpartum visit in 6 weeks with the following provider: Any provider. Additional Postpartum F/U:Incision check 1 week  Low risk pregnancy complicated by:  breech presentation, IUGR Delivery mode:  C-Section, Low Transverse  Anticipated Birth Control:  Condoms   01/18/2021 Erskine Emery, MD

## 2021-01-16 NOTE — Transfer of Care (Signed)
Immediate Anesthesia Transfer of Care Note  Patient: Christina Shaffer  Procedure(s) Performed: CESAREAN SECTION  Patient Location: PACU  Anesthesia Type:Spinal  Level of Consciousness: awake  Airway & Oxygen Therapy: Patient Spontanous Breathing  Post-op Assessment: Report given to RN and Post -op Vital signs reviewed and stable  Post vital signs: Reviewed and stable  Last Vitals:  Vitals Value Taken Time  BP    Temp    Pulse    Resp    SpO2      Last Pain:  Vitals:   01/16/21 0802  TempSrc: Oral  PainSc: 0-No pain         Complications: No notable events documented.

## 2021-01-16 NOTE — Anesthesia Postprocedure Evaluation (Signed)
Anesthesia Post Note  Patient: [name]  Procedure(s) Performed: CESAREAN SECTION     Patient location during evaluation: PACU Anesthesia Type: Spinal Level of consciousness: awake and alert and oriented Pain management: pain level controlled Vital Signs Assessment: post-procedure vital signs reviewed and stable Respiratory status: spontaneous breathing, nonlabored ventilation and respiratory function stable Cardiovascular status: blood pressure returned to baseline and stable Postop Assessment: no headache, no backache, spinal receding, patient able to bend at knees and no apparent nausea or vomiting Anesthetic complications: no   No notable events documented.  Last Vitals:  Vitals:   01/16/21 1135 01/16/21 1148  BP:  117/75  Pulse: 84 65  Resp: 17 17  Temp:    SpO2: 99% 99%    Last Pain:  Vitals:   01/16/21 1148  TempSrc:   PainSc: 0-No pain   Pain Goal:    LLE Motor Response: Purposeful movement (01/16/21 1148)   RLE Motor Response: Purposeful movement (01/16/21 1148)       Epidural/Spinal Function Cutaneous sensation: Tingles (01/16/21 1148), Patient able to flex knees: No (01/16/21 1148), Patient able to lift hips off bed: No (01/16/21 1148), Back pain beyond tenderness at insertion site: No (01/16/21 1148), Progressively worsening motor and/or sensory loss: No (01/16/21 1148), Bowel and/or bladder incontinence post epidural: No (01/16/21 1148)  Thersa Mohiuddin A.

## 2021-01-16 NOTE — Op Note (Signed)
Cesarean Section Operative Report  Christina Shaffer  01/16/2021  Indications: Breech Presentation   Pre-operative Diagnosis: Primary Cesarean Section- Breech.   Post-operative Diagnosis: Same   Surgeon: Surgeon(s) and Role:    * Wouk, Wilfred Curtis, MD - Primary    * Worthy Rancher, MD - Assisting   Attending Attestation: I was present and scrubbed for the entire procedure.   Anesthesia: Spinal    Estimated Blood Loss: 363 ml  Total IV Fluids: 1000 ml LR  Urine Output:: 100 ml   Specimens: None  Findings: Viable female infant in frank breech presentation; Apgars 8 and 9; weight pending; clear amniotic fluid; intact placenta with three vessel cord; normal uterus, fallopian tubes and ovaries bilaterally.  Baby condition / location:  Couplet care / Skin to Skin   Complications: No complications  Indications: Christina Shaffer is a 20 y.o. G1P1001 with an IUP [redacted]w[redacted]d presenting for scheduled primary Cesarean section for breech presentation.  The risks, benefits, complications, treatment options, and exected outcomes were discussed with the patient . The patient dwith the proposed plan, giving informed consent, identified as [name] and the procedure verified as C-Section Delivery.  Procedure Details:  The patient was taken back to the operative suite where spinal anesthesia was placed.  A time out was held and the above information confirmed.   After induction of anesthesia, the patient was draped and prepped in the usual sterile manner and placed in a dorsal supine position with a leftward tilt. A Pfannenstiel incision was made and carried down through the subcutaneous tissue to the fascia. Fascial incision was made and bluntly extended transversely. The fascia was separated from the underlying rectus tissue superiorly and inferiorly. The peritoneum was identified and bluntly entered and extended longitudinally. Alexis retractor was placed. A low transverse  uterine incision was made and extended bluntly. Delivered from breech presentation was a viable infant with Apgars and weight as above.  After waiting 25 seconds for delayed cord cutting, the umbilical cord was clamped and cut cord blood was obtained for evaluation. Cord ph was not sent. The placenta was removed Intact and appeared normal. The uterine outline, tubes and ovaries appeared normal. The uterine incision was closed with running locked sutures of 0-Vicryl with an imbricating layer of the same. Irrigation was performed. Hemostasis was observed. The peritoneum was closed with 2-0 Vicryl. The rectus muscles were examined and hemostasis observed. The fascia was then reapproximated with running sutures of 0-Vicryl. The subcuticular closure was performed using 2-0 plain gut. The skin was closed with 4-0 Vicryl.  Instrument, sponge, and needle counts were correct prior the abdominal closure and were correct at the conclusion of the case.   Disposition: PACU - hemodynamically stable.   Maternal Condition: Stable   Signed: Valentina Shaggy AlbertMD 01/16/2021 10:57 AM Obstetrics Fellow

## 2021-01-16 NOTE — Progress Notes (Addendum)
RN noticed pt out was <38mL per hour and dark amber. RN encouraged pt to continue drinking PO fluid frequently. RN notified Dr. Dorma Russell of pt low output and new orders were placed for LR bolus. RN will updated Dr. Dorma Russell of pt output within 4 hours of bolus. RN will continue to monitor.  Herbert Moors, RN

## 2021-01-17 LAB — HEMOGLOBIN: Hemoglobin: 10.7 g/dL — ABNORMAL LOW (ref 12.0–15.0)

## 2021-01-17 NOTE — Progress Notes (Addendum)
Post Operative Day 1  Subjective: No complaints, up ad lib, voiding, and tolerating PO. Lochia is normal. Patient says she is hydrating well today and says yesterday's fluid bolus made her feel much better. She has passed flatus.  Objective: Blood pressure 123/82, pulse 65, temperature 98 F (36.7 C), temperature source Oral, resp. rate 18, height 5\' 5"  (1.651 m), weight 108.9 kg, last menstrual period 04/18/2020, SpO2 100 %, unknown if currently breastfeeding.  Physical Exam:  General: alert and no distress Lochia: appropriate Uterine Fundus: firm Incision: healing well, mild strike through bleeding on incision site DVT Evaluation: no LE edema  Recent Labs    01/16/21 0934 01/17/21 0550  HGB 11.9* 10.7*  HCT 36.1  --     Assessment/Plan: -Patient is meeting postpartum milestones  -VSS -Hgb 10.7 post-operatively; down from 11.9 -Voiding well -BP elevated on admission; continues to be in normal range postpartum -Feeding: Bottle -Contraception: Declined; will use condoms -Plan for discharge tomorrow   LOS: 1 day   03/19/21 01/17/2021, 2:24 PM   GME ATTESTATION:  I saw and evaluated the patient. I agree with the findings and the plan of care as documented in the student's note.  Progressing well postpartum. No concerns today. Incision is healing well. Plan for discharge tomorrow.  03/19/2021, MD OB Fellow, Faculty [facility] 01/17/2021 2:24 PM

## 2021-01-18 LAB — BIRTH TISSUE RECOVERY COLLECTION (PLACENTA DONATION)

## 2021-01-18 MED ORDER — OXYCODONE HCL 5 MG PO TABS: 5.0000 mg | ORAL_TABLET | ORAL | 0 refills | Status: DC | PRN

## 2021-01-18 MED ORDER — ACETAMINOPHEN 325 MG PO TABS: 650.0000 mg | ORAL_TABLET | Freq: Four times a day (QID) | ORAL | Status: DC | PRN

## 2021-01-18 MED ORDER — IBUPROFEN 600 MG PO TABS: 600.0000 mg | ORAL_TABLET | Freq: Four times a day (QID) | ORAL | 0 refills | Status: DC

## 2021-01-21 ENCOUNTER — Telehealth: Payer: Self-pay

## 2021-01-21 DIAGNOSIS — H66002 Acute suppurative otitis media without spontaneous rupture of ear drum, left ear: Secondary | ICD-10-CM | POA: Diagnosis not present

## 2021-01-21 NOTE — Telephone Encounter (Signed)
Transition Care Management Unsuccessful Follow-up Telephone Call  Date of discharge and from where:  01/18/2021-[facility]   Attempts:  1st Attempt  Reason for unsuccessful TCM follow-up call:  Voice mail full

## 2021-01-22 NOTE — Telephone Encounter (Signed)
Transition Care Management Unsuccessful Follow-up Telephone Call  Date of discharge and from where:  01/18/2021 from [facility]  Attempts:  2nd Attempt  Reason for unsuccessful TCM follow-up call:  Voice mail full

## 2021-01-23 NOTE — Telephone Encounter (Signed)
Transition Care Management Unsuccessful Follow-up Telephone Call  Date of discharge and from where:  01/18/2021-[facility]   Attempts:  3rd Attempt  Reason for unsuccessful TCM follow-up call:  Voice mail full

## 2021-01-25 ENCOUNTER — Encounter: Payer: Self-pay | Admitting: Women's Health

## 2021-01-25 ENCOUNTER — Other Ambulatory Visit: Payer: Self-pay

## 2021-01-25 ENCOUNTER — Ambulatory Visit (INDEPENDENT_AMBULATORY_CARE_PROVIDER_SITE_OTHER): Payer: Medicaid Other | Admitting: Women's Health

## 2021-01-25 VITALS — BP 114/77 | HR 72 | Ht 65.0 in | Wt 208.0 lb

## 2021-01-25 DIAGNOSIS — Z4889 Encounter for other specified surgical aftercare: Secondary | ICD-10-CM

## 2021-01-25 NOTE — Progress Notes (Signed)
   GYN VISIT Patient name: Christina Shaffer MRN [id number]  Date of birth: 2001/02/07 Chief Complaint:   Post-op Follow-up (Incision check)  History of Present Illness:   Christina Shaffer is a 20 y.o. G26P1001 Caucasian female 9d s/p PCS for breech, being seen today for incision check. Bottlefeeding, no PPD/anxiety. Plans condoms.    No LMP recorded. The current method of family planning is abstinence.  Last pap <21yo. Results were: N/A  Depression screen [facility] 2/9 10/17/2020 07/18/2020  Decreased Interest 2 3  Down, Depressed, Hopeless 0 0  PHQ - 2 Score 2 3  Altered sleeping 0 0  Tired, decreased energy 0 0  Change in appetite 0 0  Feeling bad or failure about yourself  0 0  Trouble concentrating 0 0  Moving slowly or fidgety/restless 0 0  Suicidal thoughts 0 0  PHQ-9 Score 2 3     GAD 7 : Generalized Anxiety Score 10/17/2020 07/18/2020  Nervous, Anxious, on Edge 0 0  Control/stop worrying 0 0  Worry too much - different things 0 0  Trouble relaxing 0 0  Restless 0 0  Easily annoyed or irritable 0 0  Afraid - awful might happen 0 0  Total GAD 7 Score 0 0     Review of Systems:   Pertinent items are noted in HPI Denies fever/chills, dizziness, headaches, visual disturbances, fatigue, shortness of breath, chest pain, abdominal pain, vomiting, abnormal vaginal discharge/itching/odor/irritation, problems with periods, bowel movements, urination, or intercourse unless otherwise stated above.  Pertinent History Reviewed:  Reviewed past medical,surgical, social, obstetrical and family history.  Reviewed problem list, medications and allergies. Physical Assessment:   Vitals:   01/25/21 1119  BP: 114/77  Pulse: 72  Weight: 208 lb (94.3 kg)  Height: 5\' 5"  (1.651 m)  Body mass index is 34.61 kg/m.       Physical Examination:   General appearance: alert, well appearing, and in no distress  Mental status: alert, oriented to person, place, and time  Skin: warm & dry    Cardiovascular: normal heart rate noted  Respiratory: normal respiratory effort, no distress  Abdomen: soft, non-tender, c/s incision healing well, no s/s of infection   Pelvic: examination not indicated  Extremities: no edema   Chaperone: N/A    No results found for this or any previous visit (from the past 24 hour(s)).  Assessment & Plan:  1) 9d s/p PCS for breech> bottlefeeding  2) Incision check> healing well  Meds: No orders of the defined types were placed in this encounter.   No orders of the defined types were placed in this encounter.   Return for As scheduled for pp visit.  CNM, [facility] 01/25/2021 11:45 AM

## 2021-01-29 ENCOUNTER — Telehealth (HOSPITAL_COMMUNITY): Payer: Self-pay

## 2021-01-29 NOTE — Telephone Encounter (Signed)
No answer. The mailbox is full and cannot except any messages at this time.   Christina Shaffer [facility] 01/29/2021,1149

## 2021-02-21 ENCOUNTER — Other Ambulatory Visit: Payer: Self-pay

## 2021-02-21 ENCOUNTER — Encounter: Payer: Self-pay | Admitting: Advanced Practice Midwife

## 2021-02-21 ENCOUNTER — Ambulatory Visit (INDEPENDENT_AMBULATORY_CARE_PROVIDER_SITE_OTHER): Payer: Medicaid Other | Admitting: Advanced Practice Midwife

## 2021-02-21 DIAGNOSIS — H9202 Otalgia, left ear: Secondary | ICD-10-CM | POA: Diagnosis not present

## 2021-02-21 NOTE — Progress Notes (Signed)
Post Partum Visit Note   Chief Complaint:   Postpartum Care  History of Present Illness:   Christina Shaffer is a 20 y.o. G62P1001 Caucasian female being seen today for a postpartum visit. She is 6 weeks postpartum following a primary cesarean section, low transverse incision at 39 gestational weeks.for breech. Anesthesia: spinal.  Laceration: n/a.  Complications: none. Inpatient contraception: no.   Pregnancy uncomplicated. Tobacco use: no. Substance use disorder: no No LMP recorded (lmp unknown).  Postpartum course has been uncomplicated. Bleeding no bleeding. Bowel function is normal. Bladder function is normal. Urinary incontinence? No, fecal incontinence? No Patient is not sexually active. Last sexual activity: before pregnancy.    Upstream - 02/21/21 1055       Pregnancy Intention Screening   Does the patient want to become pregnant in the next year? Unsure    Does the patient's partner want to become pregnant in the next year? Unsure    Would the patient like to discuss contraceptive options today? No      Contraception Wrap Up   Current Method Abstinence    End Method Female Condom    Contraception Counseling Provided No            The pregnancy intention screening data noted above was reviewed. Potential methods of contraception were discussed. The patient elected to proceed with Female Condom.  Edinburgh Postpartum Depression Screening: Negative  Edinburgh Postnatal Depression Scale - 02/21/21 1051       Edinburgh Postnatal Depression Scale:  In the Past 7 Days   I have been able to laugh and see the funny side of things. 0    I have looked forward with enjoyment to things. 0    I have blamed myself unnecessarily when things went wrong. 0    I have been anxious or worried for no good reason. 0    I have felt scared or panicky for no good reason. 0    Things have been getting on top of me. 0    I have been so unhappy that I have had difficulty sleeping. 0    I have  felt sad or miserable. 0    I have been so unhappy that I have been crying. 0    The thought of harming myself has occurred to me. 0    Edinburgh Postnatal Depression Scale Total 0            Baby's course has been uncomplicated. Baby is feeding by bottle. Infant has a pediatrician/family doctor? Yes.  Childcare strategy if returning to work/school: yes.  Pt has material needs met for her and baby: Yes.   Review of Systems:   Pertinent items are noted in HPI Denies Abnormal vaginal discharge w/ itching/odor/irritation, headaches, visual changes, shortness of breath, chest pain, abdominal pain, severe nausea/vomiting, or problems with urination or bowel movements. Pertinent History Reviewed:  Reviewed past medical,surgical, obstetrical and family history.  Reviewed problem list, medications and allergies. OB History  Gravida Para Term Preterm AB Living  _0 SAB IAB Ectopic Multiple Live Births        0 1    # Outcome Date GA Lbr Len/2nd Weight Sex Delivery Anes PTL Lv  1 Term 01/16/21 [redacted]w[redacted]d 6 lb 3.3 oz (2.815 kg) F CS-LTranv Spinal  LIV   Physical Assessment:   Vitals:   02/21/21 1048  BP: 130/75  Pulse: (!) 48  Weight: 207 lb (93.9 kg)  Height: _0  (1.651 m)  Body mass index is 34.45 kg/m.  Objective:  Blood pressure 130/75, pulse (!) 48, height _1  (1.651 m), weight 207 lb (93.9 kg), not currently breastfeeding.  General:  alert, cooperative, and no distress   Breasts:  negative  Lungs: Normal respiratory effort  Heart:  regular rate and rhythm  Abdomen: soft, non-tender, incision well healed   Vulva:  not evaluated  Vagina: not evaluated  Cervix:  normal  Corpus: Well involuted  Adnexa:  not evaluated  Rectal Exam: no hemorrhoids          No results found for this or any previous visit (from the past 24 hour(s)).  Assessment & Plan:  1) Postpartum exam 2) 6 wks s/p primary cesarean section, low transverse incision 3) bottle feeding 4)  Depression screening 5) Contraception Condoms  Essential components of care per ACOG recommendations:  1.  Mood and well being:  If positive depression screen, discussed and plan developed.  If using tobacco we discussed reduction/cessation and risk of relapse If current substance abuse, we discussed and referral to local resources was offered.   2. Infant care and feeding:  If breastfeeding, discussed returning to work, pumping, breastfeeding-associated pain, guidance regarding return to fertility while lactating if not using another method. If needed, patient was provided with a letter to be allowed to pump q 2-3hrs to support lactation in a private location with access to a refrigerator to store breastmilk.   Recommended that all caregivers be immunized for flu, pertussis and other preventable communicable diseases If pt does not have material needs met for her/baby, referred to local resources for help obtaining these.  3. Sexuality, contraception and birth spacing Provided guidance regarding sexuality, management of dyspareunia, and resumption of intercourse Discussed avoiding interpregnancy interval <67mhs and recommended birth spacing of 18 months  4. Sleep and fatigue Discussed coping options for fatigue and sleep disruption Encouraged family/partner/community support of 4 hrs of uninterrupted sleep to help with mood and fatigue  5. Physical recovery  If pt had a C/S, assessed incisional pain and providing guidance on normal vs prolonged recovery If pt had a laceration, perineal healing and pain reviewed.  If urinary or fecal incontinence, discussed management and referred to PT or uro/gyn if indicated  Patient is safe to resume physical activity. Discussed attainment of healthy weight.  6.  Chronic disease management Discussed pregnancy complications if any, and their implications for future childbearing and long-term maternal health. Review recommendations for prevention of  recurrent pregnancy complications, such as 17 hydroxyprogesterone caproate to reduce risk for recurrent PTB not applicable, or aspirin to reduce risk of preeclampsia not applicable. Pt had GDM: No. If yes, 2hr GTT scheduled: not applicable. Reviewed medications and non-pregnant dosing including consideration of whether pt is breastfeeding using a reliable resource such as LactMed: not applicable Referred for f/u w/ PCP or subspecialist providers as indicated: not applicable  7. Health maintenance Mammogram at [phone] earlier if indicated Pap smears as indicated  Meds: No orders of the defined types were placed in this encounter.   Follow-up: Return for If you have any problems.   No orders of the defined types were placed in this encounter.      [place] [employer] 02/21/2021 7:41 PM

## 2021-03-19 DIAGNOSIS — H9202 Otalgia, left ear: Secondary | ICD-10-CM | POA: Diagnosis not present

## 2021-06-13 DIAGNOSIS — H5213 Myopia, bilateral: Secondary | ICD-10-CM | POA: Diagnosis not present

## 2021-08-20 DIAGNOSIS — R102 Pelvic and perineal pain: Secondary | ICD-10-CM | POA: Diagnosis not present

## 2021-11-21 ENCOUNTER — Encounter (HOSPITAL_BASED_OUTPATIENT_CLINIC_OR_DEPARTMENT_OTHER): Payer: Self-pay | Admitting: Emergency Medicine

## 2021-11-21 ENCOUNTER — Emergency Department (HOSPITAL_BASED_OUTPATIENT_CLINIC_OR_DEPARTMENT_OTHER)
Admission: EM | Admit: 2021-11-21 | Discharge: 2021-11-21 | Disposition: A | Discharge status: Home / Self Care | Payer: Medicaid Other | Attending: Emergency Medicine | Admitting: Emergency Medicine

## 2021-11-21 ENCOUNTER — Other Ambulatory Visit: Payer: Self-pay

## 2021-11-21 DIAGNOSIS — J069 Acute upper respiratory infection, unspecified: Secondary | ICD-10-CM

## 2021-11-21 DIAGNOSIS — B9789 Other viral agents as the cause of diseases classified elsewhere: Secondary | ICD-10-CM | POA: Diagnosis not present

## 2021-11-21 DIAGNOSIS — R059 Cough, unspecified: Secondary | ICD-10-CM | POA: Diagnosis present

## 2021-11-21 MED ORDER — BENZONATATE 100 MG PO CAPS: 100.0000 mg | ORAL_CAPSULE | Freq: Three times a day (TID) | ORAL | 0 refills | Status: AC

## 2021-11-21 MED ORDER — ONDANSETRON 4 MG PO TBDP: ORAL_TABLET | ORAL | 0 refills | Status: AC

## 2021-11-21 NOTE — ED Triage Notes (Signed)
Pt is c/o cough, sore throat, runny nose, and bilateral ear pain  Sxs started 3 days ago

## 2021-11-21 NOTE — Discharge Instructions (Signed)
Take tylenol 2 pills 4 times a day and motrin 4 pills 3 times a day.  Drink plenty of fluids.  Return for worsening shortness of breath, headache, confusion. Follow up with your family doctor.   

## 2021-11-21 NOTE — ED Provider Notes (Signed)
[facility] EMERGENCY DEPARTMENT Provider Note   CSN: [id number] Arrival date & time: 11/21/21  0537     History  Chief Complaint  Patient presents with   Cough    Christina Shaffer is a 21 y.o. female.  21 yo F with a chief complaints of cough congestion ear pain sore throat.  Going on for about 3 days now.  No known sick contacts no recent travel no tick exposure.  Eating and drinking normally.  No nausea vomiting or diarrhea.   Cough      Home Medications Prior to Admission medications   Medication Sig Start Date End Date Taking? Authorizing Provider  benzonatate (TESSALON) 100 MG capsule Take 1 capsule (100 mg total) by mouth every 8 (eight) hours. 11/21/21  Yes Melene Plan, DO  ondansetron (ZOFRAN-ODT) 4 MG disintegrating tablet 4mg  ODT q4 hours prn nausea/vomit 11/21/21  Yes 11/23/21, DO      Allergies    Patient has no known allergies.    Review of Systems   Review of Systems  Respiratory:  Positive for cough.     Physical Exam Updated Vital Signs BP 134/89 (BP Location: Right Arm)   Pulse 92   Temp 98.1 F (36.7 C) (Oral)   Resp 16   Ht 5\' 5"  (1.651 m)   Wt 103.4 kg   LMP 11/07/2021 (Approximate)   SpO2 100%   BMI 37.94 kg/m  Physical Exam Vitals and nursing note reviewed.  Constitutional:      General: She is not in acute distress.    Appearance: She is well-developed. She is not diaphoretic.  HENT:     Head: Normocephalic and atraumatic.     Comments: Swollen turbinates, posterior nasal drip, serous effusion to bilateral TMs without erythema or bulging or distortion of landmarks  Eyes:     Pupils: Pupils are equal, round, and reactive to light.  Cardiovascular:     Rate and Rhythm: Normal rate and regular rhythm.     Heart sounds: No murmur heard.    No friction rub. No gallop.  Pulmonary:     Effort: Pulmonary effort is normal.     Breath sounds: No wheezing or rales.  Abdominal:     General: There is no distension.      Palpations: Abdomen is soft.     Tenderness: There is no abdominal tenderness.  Musculoskeletal:        General: No tenderness.     Cervical back: Normal range of motion and neck supple.  Skin:    General: Skin is warm and dry.  Neurological:     Mental Status: She is alert and oriented to person, place, and time.  Psychiatric:        Behavior: Behavior normal.     ED Results / Procedures / Treatments   Labs (all labs ordered are listed, but only abnormal results are displayed) Labs Reviewed - No data to display  EKG None  Radiology No results found.  Procedures Procedures    Medications Ordered in ED Medications - No data to display  ED Course/ Medical Decision Making/ A&P                           Medical Decision Making Risk Prescription drug management.   21 yo F with a chief complaints of cough congestion ear pain sore throat going on for about 3 days.  Well-appearing nontoxic clear lung sounds no bacterial source  found on exam.  Do not feel that chest imaging would be beneficial.  Likely viral syndrome.  We will treat supportively.  PCP follow-up.  5:56 AM:  I have discussed the diagnosis/risks/treatment options with the patient.  Evaluation and diagnostic testing in the emergency department does not suggest an emergent condition requiring admission or immediate intervention beyond what has been performed at this time.  They will follow up with  PCP. We also discussed returning to the ED immediately if new or worsening sx occur. We discussed the sx which are most concerning (e.g., sudden worsening pain, fever, inability to tolerate by mouth) that necessitate immediate return. Medications administered to the patient during their visit and any new prescriptions provided to the patient are listed below.  Medications given during this visit Medications - No data to display   The patient appears reasonably screen and/or stabilized for discharge and I doubt any other  medical condition or other [facility] requiring further screening, evaluation, or treatment in the ED at this time prior to discharge.          Final Clinical Impression(s) / ED Diagnoses Final diagnoses:  Viral URI with cough    Rx / DC Orders ED Discharge Orders          Ordered    benzonatate (TESSALON) 100 MG capsule  Every 8 hours        11/21/21 0554    ondansetron (ZOFRAN-ODT) 4 MG disintegrating tablet        11/21/21 0554              Melene Plan, DO 11/21/21 1245

## 2021-11-22 ENCOUNTER — Telehealth: Payer: Self-pay

## 2021-11-22 NOTE — Telephone Encounter (Signed)
Transition Care Management Follow-up Telephone Call Date of discharge and from where: 11/21/2021[facility] How have you been since you were released from the hospital? Pt stated she is doing fine and has her medications. Any questions or concerns? No  Items Reviewed: Did the pt receive and understand the discharge instructions provided? Yes  Medications obtained and verified? Yes  Other? No  Any new allergies since your discharge? No  Dietary orders reviewed? No Do you have support at home? Yes   Home Care and Equipment/Supplies: Were home health services ordered? not applicable If so, what is the name of the agency? N/A  Has the agency set up a time to come to the patient's home? not applicable Were any new equipment or medical supplies ordered?  No What is the name of the medical supply agency? N/A Were you able to get the supplies/equipment? not applicable Do you have any questions related to the use of the equipment or supplies? No  Functional Questionnaire: (I = Independent and D = Dependent) ADLs: I  Bathing/Dressing- I  Meal Prep- I  Eating- I  Maintaining continence- I  Transferring/Ambulation- I  Managing Meds- I  Follow up appointments reviewed:  PCP Hospital f/u appt confirmed? No  confirmed? No   Are transportation arrangements needed? No  If their condition worsens, is the pt aware to call PCP or go to the Emergency Dept.? Yes Was the patient provided with contact information for the PCP's office or ED? Yes Was to pt encouraged to call back with questions or concerns? Yes

## 2022-06-18 ENCOUNTER — Emergency Department (HOSPITAL_BASED_OUTPATIENT_CLINIC_OR_DEPARTMENT_OTHER)
Admission: EM | Admit: 2022-06-18 | Discharge: 2022-06-19 | Disposition: A | Discharge status: Home / Self Care | Payer: Medicaid Other | Attending: Emergency Medicine | Admitting: Emergency Medicine

## 2022-06-18 ENCOUNTER — Encounter (HOSPITAL_BASED_OUTPATIENT_CLINIC_OR_DEPARTMENT_OTHER): Payer: Self-pay | Admitting: Urology

## 2022-06-18 ENCOUNTER — Other Ambulatory Visit: Payer: Self-pay

## 2022-06-18 DIAGNOSIS — L03012 Cellulitis of left finger: Secondary | ICD-10-CM | POA: Diagnosis not present

## 2022-06-18 DIAGNOSIS — M79645 Pain in left finger(s): Secondary | ICD-10-CM | POA: Diagnosis present

## 2022-06-18 NOTE — ED Triage Notes (Signed)
Pt states left thumb swelling and pain around the nail bed that was noticed yesterday, denies any drainage

## 2022-06-19 MED ORDER — LIDOCAINE-EPINEPHRINE (PF) 2 %-1:200000 IJ SOLN
10.0000 mL | Freq: Once | INTRAMUSCULAR | Status: AC
  Administered 2022-06-19: 10 mL via INTRADERMAL
  Filled 2022-06-19: qty 20

## 2022-06-19 NOTE — Discharge Instructions (Signed)
Warm compresses 4 times a day.  Please follow-up with your family doctor in the office.  Please return for rapid spreading redness or if you develop fever.

## 2022-06-19 NOTE — ED Provider Notes (Signed)
[place] EMERGENCY DEPARTMENT Provider Note   CSN: [id number] Arrival date & time: 06/18/22  2339     History  Chief Complaint  Patient presents with   finger swelling    Christina Shaffer is a 22 y.o. female.  22 yo F with a cc of a painful thumb.  Just noticed today, had some redness and pain with touching.  Tried ice without Improvement.  She does bite her nails at time to time.  She did remove hangnail from the thumb a couple days ago.        Home Medications Prior to Admission medications   Medication Sig Start Date End Date Taking? Authorizing Provider  benzonatate (TESSALON) 100 MG capsule Take 1 capsule (100 mg total) by mouth every 8 (eight) hours. 11/21/21   Deno Etienne, DO  ondansetron (ZOFRAN-ODT) 4 MG disintegrating tablet 4mg  ODT q4 hours prn nausea/vomit 11/21/21   Deno Etienne, DO      Allergies    Patient has no known allergies.    Review of Systems   Review of Systems  Physical Exam Updated Vital Signs BP 128/79 (BP Location: Left Arm)   Pulse 68   Temp 98.8 F (37.1 C)   Resp 18   Ht 5\' 5"  (1.651 m)   Wt 103.9 kg   LMP 06/13/2022 (Approximate)   SpO2 99%   BMI 38.11 kg/m  Physical Exam Vitals and nursing note reviewed.  Constitutional:      General: She is not in acute distress.    Appearance: She is well-developed. She is not diaphoretic.  HENT:     Head: Normocephalic and atraumatic.  Eyes:     Pupils: Pupils are equal, round, and reactive to light.  Cardiovascular:     Rate and Rhythm: Normal rate and regular rhythm.     Heart sounds: No murmur heard.    No friction rub. No gallop.  Pulmonary:     Effort: Pulmonary effort is normal.     Breath sounds: No wheezing or rales.  Abdominal:     General: There is no distension.     Palpations: Abdomen is soft.     Tenderness: There is no abdominal tenderness.  Musculoskeletal:        General: No tenderness.     Cervical back: Normal range of motion and neck supple.      Comments: Paronychia to the left thumb about the ulnar aspect of the finger.  Skin:    General: Skin is warm and dry.  Neurological:     Mental Status: She is alert and oriented to person, place, and time.  Psychiatric:        Behavior: Behavior normal.     ED Results / Procedures / Treatments   Labs (all labs ordered are listed, but only abnormal results are displayed) Labs Reviewed - No data to display  EKG None  Radiology No results found.  Procedures .Marland KitchenIncision and Drainage  Date/Time: 06/19/2022 12:58 AM  Performed by: Deno Etienne, DO Authorized by: Deno Etienne, DO   Consent:    Consent obtained:  Verbal   Consent given by:  Patient   Risks, benefits, and alternatives were discussed: yes     Risks discussed:  Bleeding, incomplete drainage and infection   Alternatives discussed:  No treatment Universal protocol:    Procedure explained and questions answered to patient or proxy's satisfaction: yes     Patient identity confirmed:  Verbally with patient Location:    Type:  Abscess   Location:  Upper extremity   Upper extremity location:  Finger   Finger location:  L thumb Pre-procedure details:    Skin preparation:  Chlorhexidine Anesthesia:    Anesthesia method:  Nerve block   Block location:  Digital block   Block needle gauge:  25 G   Block anesthetic:  Lidocaine 2% WITH epi   Block technique:  Digital   Block injection procedure:  Anatomic landmarks identified, anatomic landmarks palpated and negative aspiration for blood   Block outcome:  Anesthesia achieved Procedure type:    Complexity:  Complex Procedure details:    Ultrasound guidance: no     Needle aspiration: no     Incision types:  Stab incision   Incision depth:  Subcutaneous   Wound management:  Probed and deloculated   Drainage:  Bloody and purulent   Drainage amount:  Moderate   Wound treatment:  Wound left open   Packing materials:  None Post-procedure details:    Procedure completion:   Tolerated well, no immediate complications     Medications Ordered in ED Medications  lidocaine-EPINEPHrine (XYLOCAINE W/EPI) 2 %-1:200000 (PF) injection 10 mL (10 mLs Intradermal Given 06/19/22 0025)    ED Course/ Medical Decision Making/ A&P                           Medical Decision Making Risk Prescription drug management.   22 yo F With a chief complaints of a paronychia.  This was drained at bedside.  Warm compresses.  PCP follow-up.  12:59 AM:  I have discussed the diagnosis/risks/treatment options with the patient.  Evaluation and diagnostic testing in the emergency department does not suggest an emergent condition requiring admission or immediate intervention beyond what has been performed at this time.  They will follow up with PCP. We also discussed returning to the ED immediately if new or worsening sx occur. We discussed the sx which are most concerning (e.g., sudden worsening pain, fever, inability to tolerate by mouth) that necessitate immediate return. Medications administered to the patient during their visit and any new prescriptions provided to the patient are listed below.  Medications given during this visit Medications  lidocaine-EPINEPHrine (XYLOCAINE W/EPI) 2 %-1:200000 (PF) injection 10 mL (10 mLs Intradermal Given 06/19/22 0025)     The patient appears reasonably screen and/or stabilized for discharge and I doubt any other medical condition or other [facility] requiring further screening, evaluation, or treatment in the ED at this time prior to discharge.          Final Clinical Impression(s) / ED Diagnoses Final diagnoses:  Paronychia of finger, left    Rx / DC Orders ED Discharge Orders     None         Deno Etienne, DO 06/19/22 6010

## 2022-06-20 ENCOUNTER — Telehealth: Payer: Self-pay

## 2022-06-20 NOTE — Patient Outreach (Signed)
Transition Care Management Follow-up Telephone Call Date of discharge and from where: 06/19/22 [facility] How have you been since you were released from the hospital? Feeling a lot better Any questions or concerns? Yes  Items Reviewed: Did the pt receive and understand the discharge instructions provided? Yes  Medications obtained and verified? No  Other? No  Any new allergies since your discharge? No  Dietary orders reviewed? No Do you have support at home? Yes   Home Care and Equipment/Supplies: Were home health services ordered? not applicable If so, what is the name of the agency?   Has the agency set up a time to come to the patient's home? not applicable Were any new equipment or medical supplies ordered?  No What is the name of the medical supply agency?  Were you able to get the supplies/equipment? not applicable Do you have any questions related to the use of the equipment or supplies? No  Functional Questionnaire: (I = Independent and D = Dependent) ADLs: I  Bathing/Dressing- I  Meal Prep- I  Eating- I  Maintaining continence- I  Transferring/Ambulation- I  Managing Meds- I  Follow up appointments reviewed:  PCP Hospital f/u appt confirmed? No  Scheduled to see  on  @ . [facility] f/u appt confirmed? No  Scheduled to see  on  @ . Are transportation arrangements needed? No   If their condition worsens, is the pt aware to call PCP or go to the Emergency Dept.? Yes Was the patient provided with contact information for the PCP's office or ED? Yes Was to pt encouraged to call back with questions or concerns? Yes

## 2022-07-10 ENCOUNTER — Other Ambulatory Visit: Payer: Self-pay

## 2022-07-10 DIAGNOSIS — Z5321 Procedure and treatment not carried out due to patient leaving prior to being seen by health care provider: Secondary | ICD-10-CM | POA: Insufficient documentation

## 2022-07-10 DIAGNOSIS — R220 Localized swelling, mass and lump, head: Secondary | ICD-10-CM | POA: Diagnosis not present

## 2022-07-10 NOTE — ED Triage Notes (Signed)
Pt with swollen bump to chin.  States she thought it was a pimple and tried to "pop" it but only a small amount of blood came out. States it is hard and sore now.

## 2022-07-11 ENCOUNTER — Emergency Department (HOSPITAL_BASED_OUTPATIENT_CLINIC_OR_DEPARTMENT_OTHER)
Admission: EM | Admit: 2022-07-11 | Discharge: 2022-07-11 | Discharge status: Against Medical Advice | Payer: Medicaid Other | Attending: Emergency Medicine | Admitting: Emergency Medicine

## 2022-07-30 DIAGNOSIS — L719 Rosacea, unspecified: Secondary | ICD-10-CM | POA: Diagnosis not present

## 2022-07-30 DIAGNOSIS — M545 Low back pain, unspecified: Secondary | ICD-10-CM | POA: Diagnosis not present

## 2022-07-30 DIAGNOSIS — R1031 Right lower quadrant pain: Secondary | ICD-10-CM | POA: Diagnosis not present

## 2022-07-30 DIAGNOSIS — R1032 Left lower quadrant pain: Secondary | ICD-10-CM | POA: Diagnosis not present

## 2022-08-25 ENCOUNTER — Encounter (HOSPITAL_BASED_OUTPATIENT_CLINIC_OR_DEPARTMENT_OTHER): Payer: Self-pay | Admitting: Emergency Medicine

## 2022-08-25 ENCOUNTER — Emergency Department (HOSPITAL_BASED_OUTPATIENT_CLINIC_OR_DEPARTMENT_OTHER)
Admission: EM | Admit: 2022-08-25 | Discharge: 2022-08-26 | Discharge status: Against Medical Advice | Payer: Medicaid Other | Attending: Emergency Medicine | Admitting: Emergency Medicine

## 2022-08-25 ENCOUNTER — Other Ambulatory Visit: Payer: Self-pay

## 2022-08-25 DIAGNOSIS — R110 Nausea: Secondary | ICD-10-CM | POA: Insufficient documentation

## 2022-08-25 DIAGNOSIS — R109 Unspecified abdominal pain: Secondary | ICD-10-CM | POA: Diagnosis not present

## 2022-08-25 DIAGNOSIS — R197 Diarrhea, unspecified: Secondary | ICD-10-CM | POA: Diagnosis not present

## 2022-08-25 DIAGNOSIS — Z5321 Procedure and treatment not carried out due to patient leaving prior to being seen by health care provider: Secondary | ICD-10-CM | POA: Insufficient documentation

## 2022-08-25 LAB — URINALYSIS, ROUTINE W REFLEX MICROSCOPIC
Bilirubin Urine: NEGATIVE
Glucose, UA: NEGATIVE mg/dL
Hgb urine dipstick: NEGATIVE
Ketones, ur: NEGATIVE mg/dL
Leukocytes,Ua: NEGATIVE
Nitrite: NEGATIVE
Protein, ur: NEGATIVE mg/dL
Specific Gravity, Urine: >=1.03 (ref 1.005–1.030)
pH: 5.5 (ref 5.0–8.0)

## 2022-08-25 LAB — COMPREHENSIVE METABOLIC PANEL
ALT: 22 U/L (ref 0–44)
AST: 20 U/L (ref 15–41)
Albumin: 4.5 g/dL (ref 3.5–5.0)
Alkaline Phosphatase: 77 U/L (ref 38–126)
Anion gap: 7 (ref 5–15)
BUN: 16 mg/dL (ref 6–20)
CO2: 24 mmol/L (ref 22–32)
Calcium: 9 mg/dL (ref 8.9–10.3)
Chloride: 103 mmol/L (ref 98–111)
Creatinine, Ser: 0.91 mg/dL (ref 0.44–1.00)
GFR, Estimated: >60 mL/min (ref >60)
Glucose, Bld: 90 mg/dL (ref 70–99)
Potassium: 3.7 mmol/L (ref 3.5–5.1)
Sodium: 134 mmol/L — ABNORMAL LOW (ref 135–145)
Total Bilirubin: 0.9 mg/dL (ref 0.3–1.2)
Total Protein: 8 g/dL (ref 6.5–8.1)

## 2022-08-25 LAB — CBC
HCT: 45.2 % (ref 36.0–46.0)
Hemoglobin: 15.1 g/dL — ABNORMAL HIGH (ref 12.0–15.0)
MCH: 28 pg (ref 26.0–34.0)
MCHC: 33.4 g/dL (ref 30.0–36.0)
MCV: 83.7 fL (ref 80.0–100.0)
Platelets: 265 10*3/uL (ref 150–400)
RBC: 5.4 MIL/uL — ABNORMAL HIGH (ref 3.87–5.11)
RDW: 13.9 % (ref 11.5–15.5)
WBC: 7.6 10*3/uL (ref 4.0–10.5)
nRBC: 0 % (ref 0.0–0.2)

## 2022-08-25 LAB — PREGNANCY, URINE: Preg Test, Ur: NEGATIVE

## 2022-08-25 LAB — LIPASE, BLOOD: Lipase: 34 U/L (ref 11–51)

## 2022-08-25 NOTE — ED Triage Notes (Signed)
Pt here for R side abd pain that started today w/ nausea and diarrhea, denies vomiting. Pt denies fevers/chills.

## 2022-08-28 DIAGNOSIS — R1031 Right lower quadrant pain: Secondary | ICD-10-CM | POA: Diagnosis not present

## 2022-08-28 DIAGNOSIS — I880 Nonspecific mesenteric lymphadenitis: Secondary | ICD-10-CM | POA: Diagnosis not present

## 2022-08-28 DIAGNOSIS — R109 Unspecified abdominal pain: Secondary | ICD-10-CM | POA: Diagnosis not present

## 2022-08-29 DIAGNOSIS — R109 Unspecified abdominal pain: Secondary | ICD-10-CM | POA: Diagnosis not present

## 2022-09-02 ENCOUNTER — Ambulatory Visit: Payer: Medicaid Other | Admitting: Adult Health

## 2022-09-22 ENCOUNTER — Encounter: Payer: Self-pay | Admitting: *Deleted

## 2023-03-23 DIAGNOSIS — F410 Panic disorder [episodic paroxysmal anxiety] without agoraphobia: Secondary | ICD-10-CM | POA: Diagnosis not present

## 2023-03-23 DIAGNOSIS — Z6838 Body mass index (BMI) 38.0-38.9, adult: Secondary | ICD-10-CM | POA: Diagnosis not present

## 2023-03-23 DIAGNOSIS — E782 Mixed hyperlipidemia: Secondary | ICD-10-CM | POA: Diagnosis not present

## 2023-03-23 DIAGNOSIS — E66812 Obesity, class 2: Secondary | ICD-10-CM | POA: Diagnosis not present

## 2023-04-10 DIAGNOSIS — R059 Cough, unspecified: Secondary | ICD-10-CM | POA: Diagnosis not present

## 2023-05-08 DIAGNOSIS — B349 Viral infection, unspecified: Secondary | ICD-10-CM | POA: Diagnosis not present

## 2023-05-08 DIAGNOSIS — B9789 Other viral agents as the cause of diseases classified elsewhere: Secondary | ICD-10-CM | POA: Diagnosis not present

## 2023-05-08 DIAGNOSIS — I880 Nonspecific mesenteric lymphadenitis: Secondary | ICD-10-CM | POA: Diagnosis not present

## 2023-05-08 DIAGNOSIS — J029 Acute pharyngitis, unspecified: Secondary | ICD-10-CM | POA: Diagnosis not present

## 2023-05-08 DIAGNOSIS — R1031 Right lower quadrant pain: Secondary | ICD-10-CM | POA: Diagnosis not present

## 2023-05-08 DIAGNOSIS — Z1152 Encounter for screening for COVID-19: Secondary | ICD-10-CM | POA: Diagnosis not present

## 2023-05-09 DIAGNOSIS — Z6838 Body mass index (BMI) 38.0-38.9, adult: Secondary | ICD-10-CM | POA: Diagnosis not present

## 2023-05-09 DIAGNOSIS — H9203 Otalgia, bilateral: Secondary | ICD-10-CM | POA: Diagnosis not present

## 2023-05-09 DIAGNOSIS — E669 Obesity, unspecified: Secondary | ICD-10-CM | POA: Diagnosis not present

## 2023-05-09 DIAGNOSIS — S29012A Strain of muscle and tendon of back wall of thorax, initial encounter: Secondary | ICD-10-CM | POA: Diagnosis not present

## 2023-05-09 DIAGNOSIS — R109 Unspecified abdominal pain: Secondary | ICD-10-CM | POA: Diagnosis not present

## 2023-05-22 DIAGNOSIS — Z6838 Body mass index (BMI) 38.0-38.9, adult: Secondary | ICD-10-CM | POA: Diagnosis not present

## 2023-05-22 DIAGNOSIS — Z113 Encounter for screening for infections with a predominantly sexual mode of transmission: Secondary | ICD-10-CM | POA: Diagnosis not present

## 2023-05-22 DIAGNOSIS — F411 Generalized anxiety disorder: Secondary | ICD-10-CM | POA: Diagnosis not present

## 2023-05-22 DIAGNOSIS — E66812 Obesity, class 2: Secondary | ICD-10-CM | POA: Diagnosis not present

## 2023-06-09 DIAGNOSIS — J101 Influenza due to other identified influenza virus with other respiratory manifestations: Secondary | ICD-10-CM | POA: Diagnosis not present

## 2023-07-21 DIAGNOSIS — E66812 Obesity, class 2: Secondary | ICD-10-CM | POA: Diagnosis not present

## 2023-07-21 DIAGNOSIS — R300 Dysuria: Secondary | ICD-10-CM | POA: Diagnosis not present

## 2023-07-21 DIAGNOSIS — Z111 Encounter for screening for respiratory tuberculosis: Secondary | ICD-10-CM | POA: Diagnosis not present

## 2023-07-21 DIAGNOSIS — F411 Generalized anxiety disorder: Secondary | ICD-10-CM | POA: Diagnosis not present

## 2023-07-21 DIAGNOSIS — Z6838 Body mass index (BMI) 38.0-38.9, adult: Secondary | ICD-10-CM | POA: Diagnosis not present

## 2023-09-14 DIAGNOSIS — S30861A Insect bite (nonvenomous) of abdominal wall, initial encounter: Secondary | ICD-10-CM | POA: Diagnosis not present

## 2023-09-14 DIAGNOSIS — T7840XA Allergy, unspecified, initial encounter: Secondary | ICD-10-CM | POA: Diagnosis not present

## 2023-09-16 DIAGNOSIS — W57XXXA Bitten or stung by nonvenomous insect and other nonvenomous arthropods, initial encounter: Secondary | ICD-10-CM | POA: Diagnosis not present

## 2023-09-16 DIAGNOSIS — R21 Rash and other nonspecific skin eruption: Secondary | ICD-10-CM | POA: Diagnosis not present

## 2023-09-16 DIAGNOSIS — S30861A Insect bite (nonvenomous) of abdominal wall, initial encounter: Secondary | ICD-10-CM | POA: Diagnosis not present

## 2023-09-28 DIAGNOSIS — R1030 Lower abdominal pain, unspecified: Secondary | ICD-10-CM | POA: Diagnosis not present

## 2023-09-28 DIAGNOSIS — R1033 Periumbilical pain: Secondary | ICD-10-CM | POA: Diagnosis not present

## 2023-09-28 DIAGNOSIS — R1032 Left lower quadrant pain: Secondary | ICD-10-CM | POA: Diagnosis not present

## 2024-02-09 DIAGNOSIS — E782 Mixed hyperlipidemia: Secondary | ICD-10-CM | POA: Diagnosis not present

## 2024-02-09 DIAGNOSIS — Z6838 Body mass index (BMI) 38.0-38.9, adult: Secondary | ICD-10-CM | POA: Diagnosis not present

## 2024-02-09 DIAGNOSIS — E66812 Obesity, class 2: Secondary | ICD-10-CM | POA: Diagnosis not present

## 2024-02-09 DIAGNOSIS — Z0000 Encounter for general adult medical examination without abnormal findings: Secondary | ICD-10-CM | POA: Diagnosis not present

## 2024-04-06 DIAGNOSIS — B349 Viral infection, unspecified: Secondary | ICD-10-CM | POA: Diagnosis not present

## 2024-04-06 DIAGNOSIS — H9202 Otalgia, left ear: Secondary | ICD-10-CM | POA: Diagnosis not present

## 2024-04-06 DIAGNOSIS — N912 Amenorrhea, unspecified: Secondary | ICD-10-CM | POA: Diagnosis not present

## 2024-04-28 DIAGNOSIS — J029 Acute pharyngitis, unspecified: Secondary | ICD-10-CM | POA: Diagnosis not present

## 2024-05-21 ENCOUNTER — Emergency Department (HOSPITAL_BASED_OUTPATIENT_CLINIC_OR_DEPARTMENT_OTHER)
Admission: EM | Admit: 2024-05-21 | Discharge: 2024-05-21 | Disposition: A | Discharge status: Home / Self Care | Attending: Emergency Medicine | Admitting: Emergency Medicine

## 2024-05-21 ENCOUNTER — Encounter (HOSPITAL_BASED_OUTPATIENT_CLINIC_OR_DEPARTMENT_OTHER): Payer: Self-pay | Admitting: Emergency Medicine

## 2024-05-21 ENCOUNTER — Other Ambulatory Visit: Payer: Self-pay

## 2024-05-21 DIAGNOSIS — B9689 Other specified bacterial agents as the cause of diseases classified elsewhere: Secondary | ICD-10-CM | POA: Diagnosis not present

## 2024-05-21 DIAGNOSIS — N760 Acute vaginitis: Secondary | ICD-10-CM | POA: Insufficient documentation

## 2024-05-21 LAB — WET PREP, GENITAL
Sperm: NONE SEEN
Trich, Wet Prep: NONE SEEN
WBC, Wet Prep HPF POC: <10 (ref <10)
Yeast Wet Prep HPF POC: NONE SEEN

## 2024-05-21 LAB — URINALYSIS, ROUTINE W REFLEX MICROSCOPIC
Bilirubin Urine: NEGATIVE
Glucose, UA: NEGATIVE mg/dL
Ketones, ur: NEGATIVE mg/dL
Nitrite: NEGATIVE
Protein, ur: NEGATIVE mg/dL
Specific Gravity, Urine: 1.025 (ref 1.005–1.030)
pH: 7 (ref 5.0–8.0)

## 2024-05-21 LAB — PREGNANCY, URINE: Preg Test, Ur: NEGATIVE

## 2024-05-21 LAB — URINALYSIS, MICROSCOPIC (REFLEX)

## 2024-05-21 MED ORDER — METRONIDAZOLE 500 MG PO TABS: 500.0000 mg | ORAL_TABLET | Freq: Two times a day (BID) | ORAL | 0 refills | Status: AC

## 2024-05-21 MED ORDER — FLUCONAZOLE 150 MG PO TABS: 150.0000 mg | ORAL_TABLET | Freq: Every day | ORAL | 0 refills | Status: AC

## 2024-05-21 NOTE — ED Notes (Signed)
 Pt instructed to self swab, given materials and directions. Pt verbalized understanding.

## 2024-05-21 NOTE — Discharge Instructions (Signed)
 Begin taking medications as prescribed.  Avoid douching or putting anything else in the area.  Follow-up with primary care doctor in 1 week for any continued symptoms.  Return to ED if symptoms worsen including severe uncontrolled discharge, severe abdominal pain, new fevers.

## 2024-05-21 NOTE — ED Notes (Signed)

## 2024-05-21 NOTE — ED Provider Notes (Signed)
 [place] [facility] Provider Note   CSN: [id number] Arrival date & time: 05/21/24  1924     Patient presents with: Vaginal Discharge   Christina Shaffer is a 23 y.o. female.  Patient is a 23 year old female with no significant medical history who presents to the ED for increased vaginal discharge that began yesterday.  Patient notes that she has had increased white discharge but has not noticed any worse.  Notes the area will itch when she does discharge but has not noticed any rashes.  States she is sexually active with 1 partner and denies concerns for STIs.  Denies fevers or abdominal pain.  Notes she started her menstrual cycle today.  No further complaints.    Vaginal Discharge Associated symptoms: no abdominal pain and no fever        Prior to Admission medications  Medication Sig Start Date End Date Taking? Authorizing Provider  fluconazole  (DIFLUCAN ) 150 MG tablet Take 1 tablet (150 mg total) by mouth daily for 1 dose. 05/21/24 05/22/24 Yes Macen Joslin, Thersia RAMAN, PA-C  metroNIDAZOLE  (FLAGYL ) 500 MG tablet Take 1 tablet (500 mg total) by mouth 2 (two) times daily. 05/21/24  Yes Shanikka Wonders, Thersia RAMAN, PA-C  benzonatate  (TESSALON ) 100 MG capsule Take 1 capsule (100 mg total) by mouth every 8 (eight) hours. 11/21/21   Emil Share, DO  ondansetron  (ZOFRAN -ODT) 4 MG disintegrating tablet 4mg  ODT q4 hours prn nausea/vomit 11/21/21   Floyd, Dan, DO    Allergies: Patient has no known allergies.    Review of Systems  Constitutional:  Negative for chills and fever.  Gastrointestinal:  Negative for abdominal pain.  Genitourinary:  Positive for vaginal bleeding and vaginal discharge. Negative for pelvic pain and vaginal pain.  All other systems reviewed and are negative.   Updated Vital Signs BP 107/76   Pulse 61   Temp 98.3 F (36.8 C) (Oral)   Resp 17   Ht 5' 5 (1.651 m)   Wt 96.6 kg   SpO2 100%   BMI 35.45 kg/m   Physical  Exam Constitutional:      Appearance: Normal appearance.  HENT:     Head: Normocephalic and atraumatic.     Nose: Nose normal.     Mouth/Throat:     Mouth: Mucous membranes are moist.     Pharynx: Oropharynx is clear.  Cardiovascular:     Rate and Rhythm: Normal rate.  Pulmonary:     Effort: Pulmonary effort is normal.  Abdominal:     General: Bowel sounds are normal.     Palpations: Abdomen is soft.     Tenderness: There is no abdominal tenderness.  Skin:    General: Skin is warm and dry.  Neurological:     Mental Status: She is alert and oriented to person, place, and time.  Psychiatric:        Mood and Affect: Mood normal.        Behavior: Behavior normal.     (all labs ordered are listed, but only abnormal results are displayed) Labs Reviewed  WET PREP, GENITAL - Abnormal; Notable for the following components:      Result Value   Clue Cells Wet Prep HPF POC PRESENT (*)    All other components within normal limits  URINALYSIS, ROUTINE W REFLEX MICROSCOPIC - Abnormal; Notable for the following components:   APPearance HAZY (*)    Hgb urine dipstick LARGE (*)    Leukocytes,Ua TRACE (*)    All  other components within normal limits  URINALYSIS, MICROSCOPIC (REFLEX) - Abnormal; Notable for the following components:   Bacteria, UA FEW (*)    All other components within normal limits  PREGNANCY, URINE  GC/CHLAMYDIA PROBE AMP ([place]) NOT AT [facility]    EKG: None  Radiology: No results found.    Medications Ordered in the ED - No data to display                                 Medical Decision Making Patient is a 23 year old female with no significant medical history who presents to the ED for increasing vaginal discharge that began yesterday.  On exam patient is alert and well-appearing.  Physical exam as noted above.  No abdominal tenderness appreciated.  Patient declines a pelvic exam today.  Less concerns for PID or TOA as patient has no tenderness and  afebrile.  Wet prep is positive for clue cells that is concerning for BV.  STI testing is pending, less concerns as patient denies any new sexual partners.  UA shows hematuria but she is currently on her menstrual cycle.  Otherwise no acute signs of infection.  Patient is otherwise well-appearing and vital signs stable.  Stable for discharge home.  Prescribed Flagyl  and Diflucan .  Symptomatic care discussed.  Advised PCP follow-up in 1 week if no improvement.  Strict return precautions provided for worsening symptoms.  Amount and/or Complexity of Data Reviewed Labs: ordered.  Risk Prescription drug management.      Final diagnoses:  BV (bacterial vaginosis)    ED Discharge Orders          Ordered    metroNIDAZOLE  (FLAGYL ) 500 MG tablet  2 times daily        05/21/24 2116    fluconazole  (DIFLUCAN ) 150 MG tablet  Daily        05/21/24 2116               Neysa Thersia RAMAN, PA-C 05/22/24 0011    Cottie Donnice PARAS, MD 05/22/24 1007

## 2024-05-21 NOTE — ED Triage Notes (Signed)
 Pt c/o vaginal discharge/irritation since yesterday

## 2024-05-23 LAB — GC/CHLAMYDIA PROBE AMP (~~LOC~~) NOT AT ARMC
Chlamydia: NEGATIVE
Comment: NEGATIVE
Comment: NORMAL
Neisseria Gonorrhea: NEGATIVE
